# Patient Record
Sex: Male | Born: 1980 | Race: White | Hispanic: No | Marital: Single | Smoking: Current every day smoker
Health system: Southern US, Community
[De-identification: ages and names within clinical notes are randomized; demographics above are authoritative.]

## PROBLEM LIST (undated history)

## (undated) ENCOUNTER — Ambulatory Visit: Payer: BC Managed Care – PPO | Source: Home / Self Care

## (undated) DIAGNOSIS — Z98811 Dental restoration status: Secondary | ICD-10-CM

## (undated) DIAGNOSIS — S20411A Abrasion of right back wall of thorax, initial encounter: Secondary | ICD-10-CM

## (undated) DIAGNOSIS — S52501A Unspecified fracture of the lower end of right radius, initial encounter for closed fracture: Secondary | ICD-10-CM

## (undated) DIAGNOSIS — G40909 Epilepsy, unspecified, not intractable, without status epilepticus: Secondary | ICD-10-CM

## (undated) DIAGNOSIS — F329 Major depressive disorder, single episode, unspecified: Secondary | ICD-10-CM

## (undated) DIAGNOSIS — S43006A Unspecified dislocation of unspecified shoulder joint, initial encounter: Secondary | ICD-10-CM

## (undated) DIAGNOSIS — R569 Unspecified convulsions: Secondary | ICD-10-CM

## (undated) DIAGNOSIS — F32A Depression, unspecified: Secondary | ICD-10-CM

## (undated) HISTORY — PX: WISDOM TOOTH EXTRACTION: SHX21

## (undated) HISTORY — DX: Epilepsy, unspecified, not intractable, without status epilepticus: G40.909

---

## 2007-04-04 ENCOUNTER — Ambulatory Visit (HOSPITAL_COMMUNITY): Admission: RE | Admit: 2007-04-04 | Discharge: 2007-04-04 | Payer: Self-pay | Admitting: Family Medicine

## 2007-07-24 ENCOUNTER — Emergency Department (HOSPITAL_COMMUNITY): Admission: EM | Admit: 2007-07-24 | Discharge: 2007-07-24 | Payer: Self-pay | Admitting: Emergency Medicine

## 2007-08-07 ENCOUNTER — Inpatient Hospital Stay (HOSPITAL_COMMUNITY): Admission: EM | Admit: 2007-08-07 | Discharge: 2007-08-09 | Payer: Self-pay | Admitting: Emergency Medicine

## 2007-12-09 IMAGING — CT CT HEAD W/O CM
1 series · 16 of 30 positions shown, 20 images · non-contrast
Comparison: none

CLINICAL DATA: Seizure

[Series 2: head routine 4.8 h47s · axial · 0.41mm/px · z∈[-149,-19]mm · 16 of 30 slices shown, 20 images]
[im 2/30  brain]
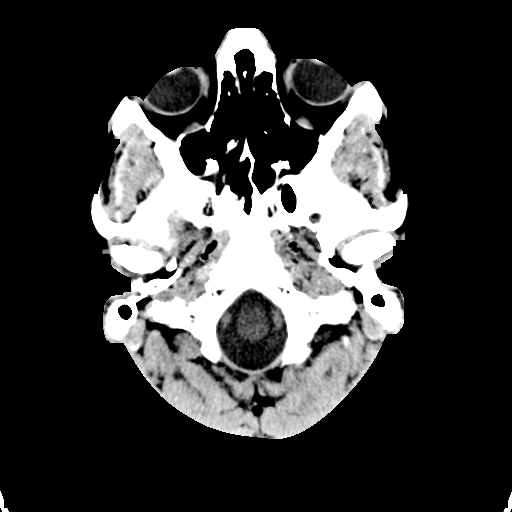
[im 2/30  bone]
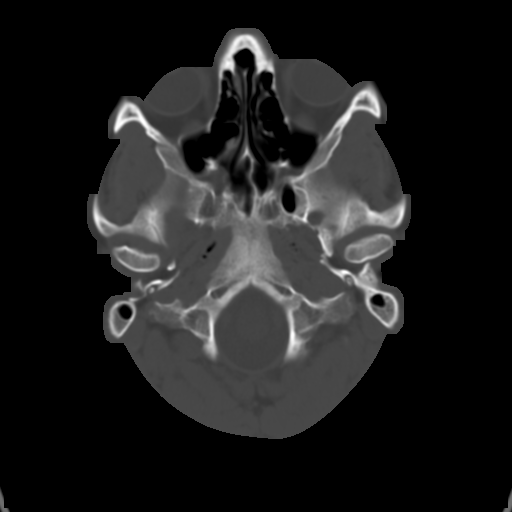
[im 4/30  brain]
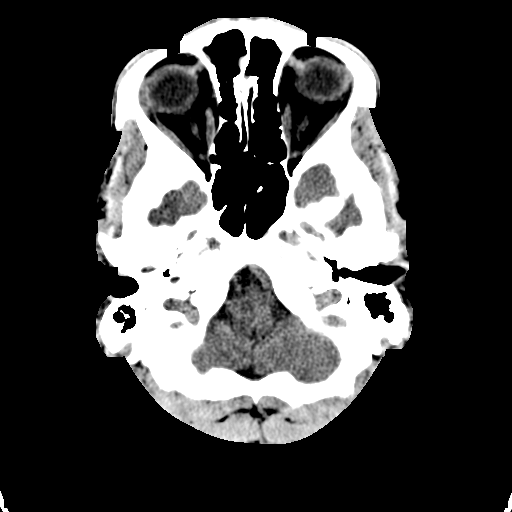
[im 6/30  brain]
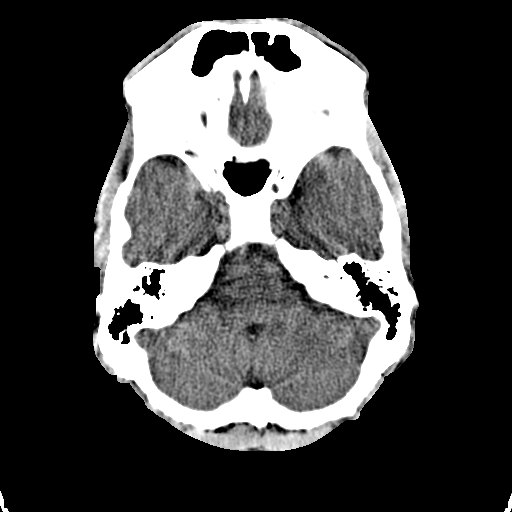
[im 8/30  brain]
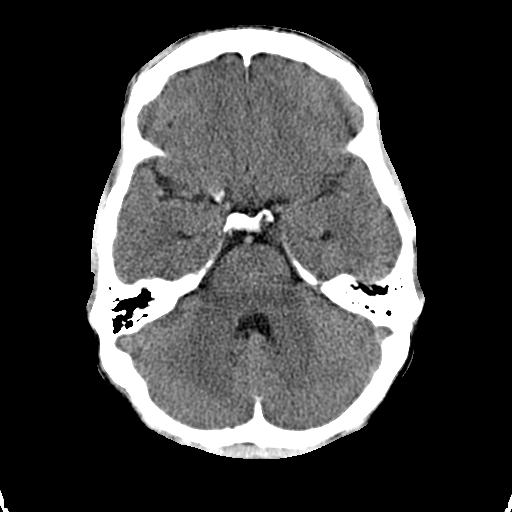
[im 9/30  brain]
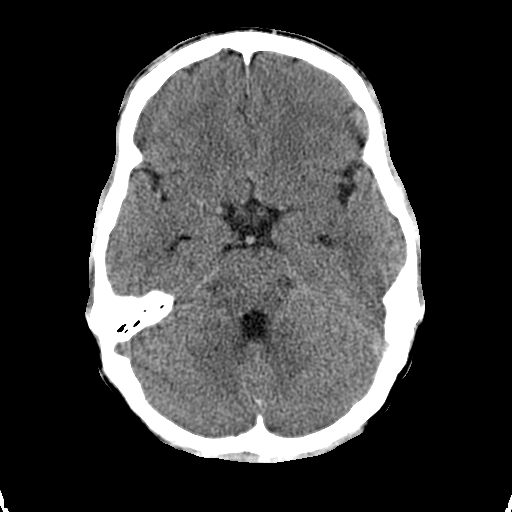
[im 9/30  bone]
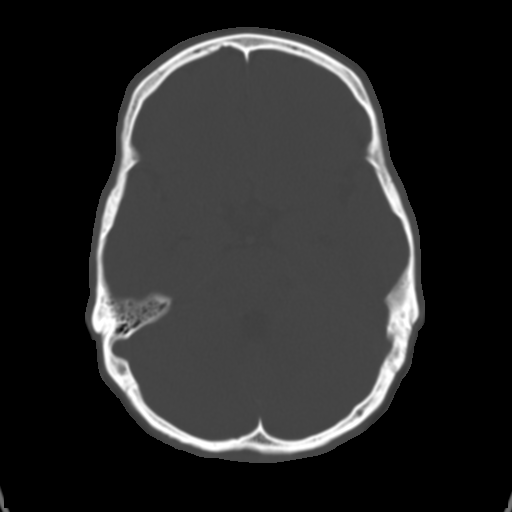
[im 11/30  brain]
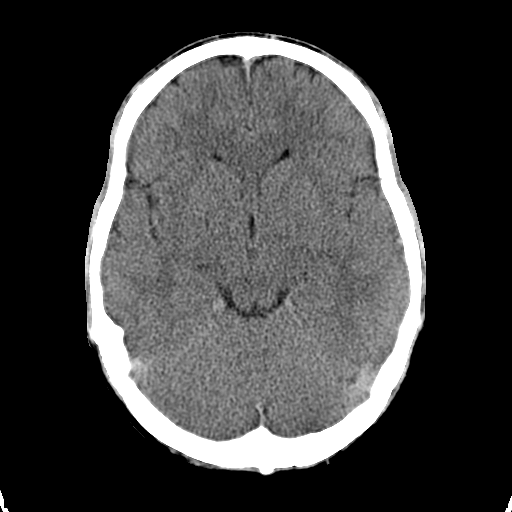
[im 13/30  brain]
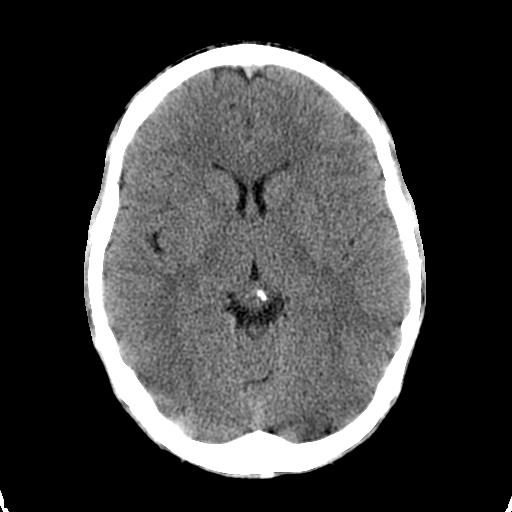
[im 15/30  brain]
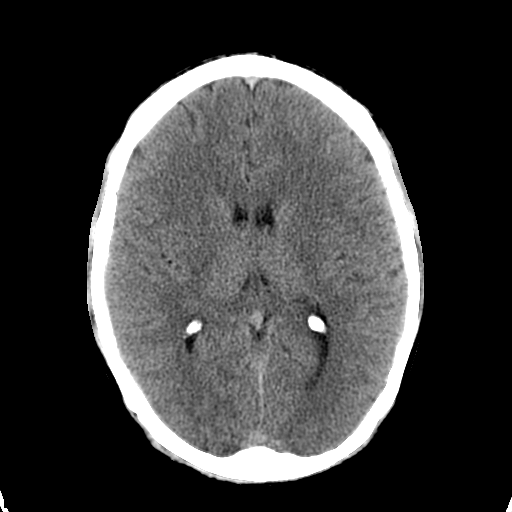
[im 16/30  brain]
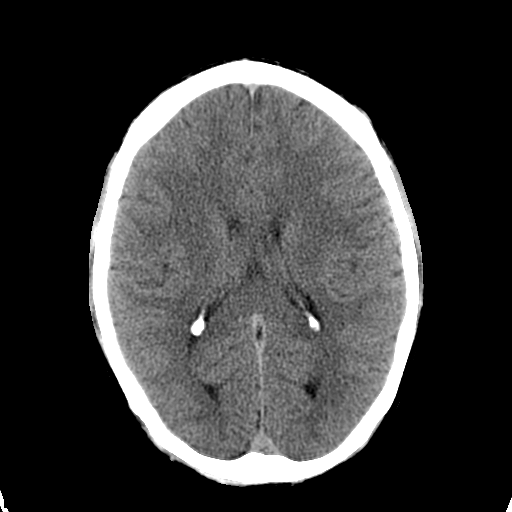
[im 16/30  bone]
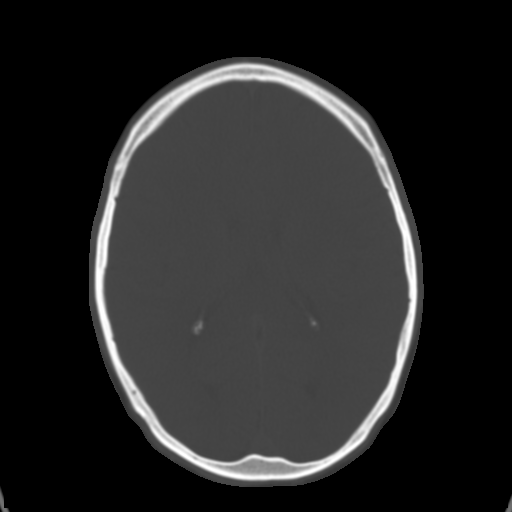
[im 18/30  brain]
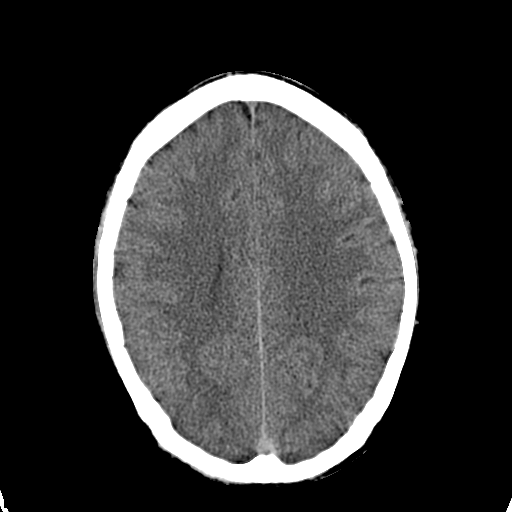
[im 20/30  brain]
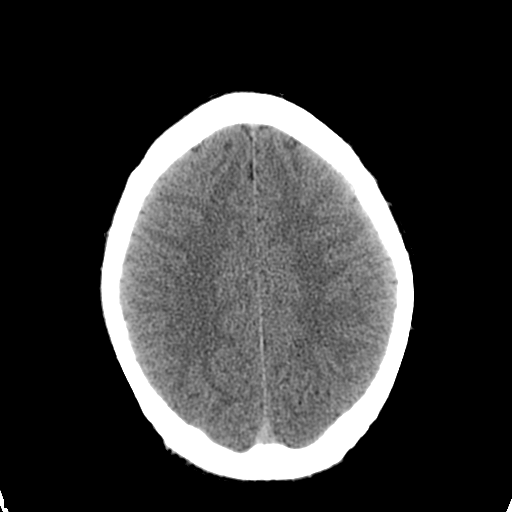
[im 22/30  brain]
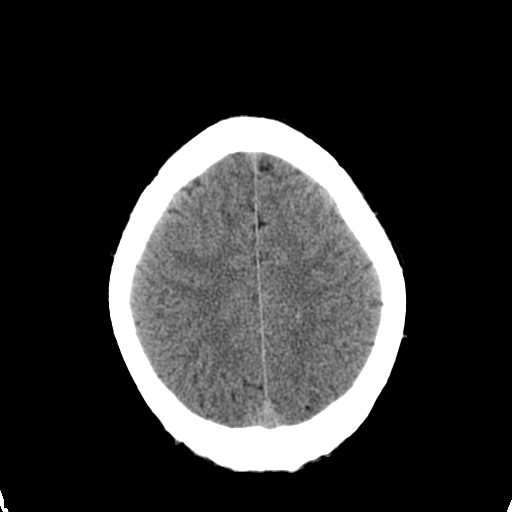
[im 23/30  brain]
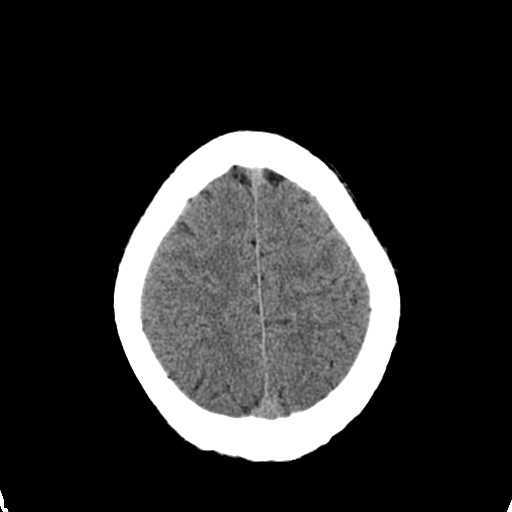
[im 23/30  bone]
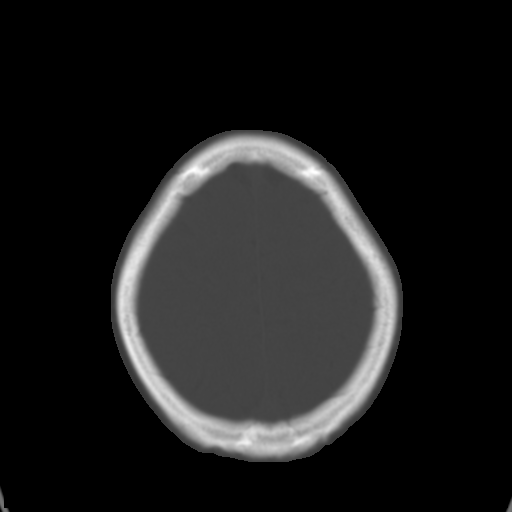
[im 25/30  brain]
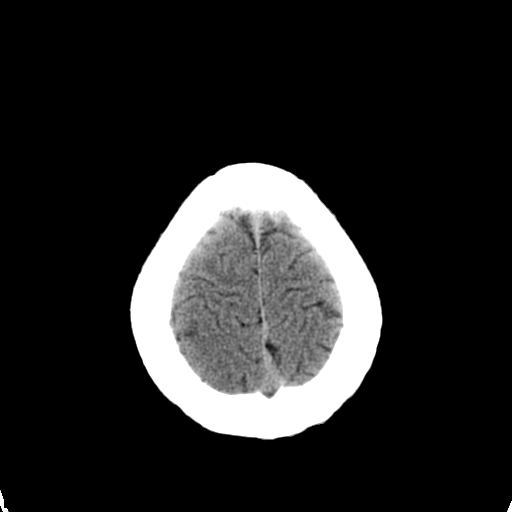
[im 27/30  brain]
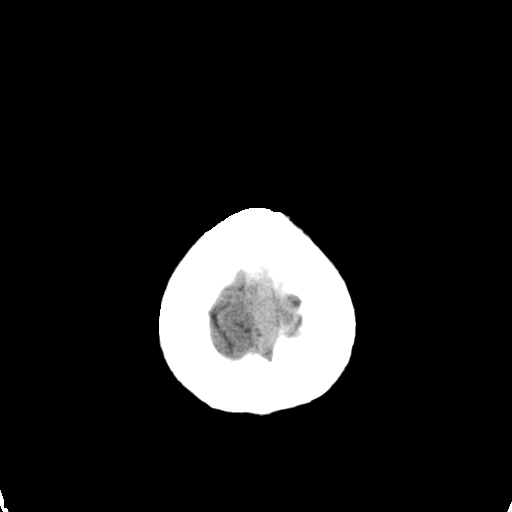
[im 29/30  brain]
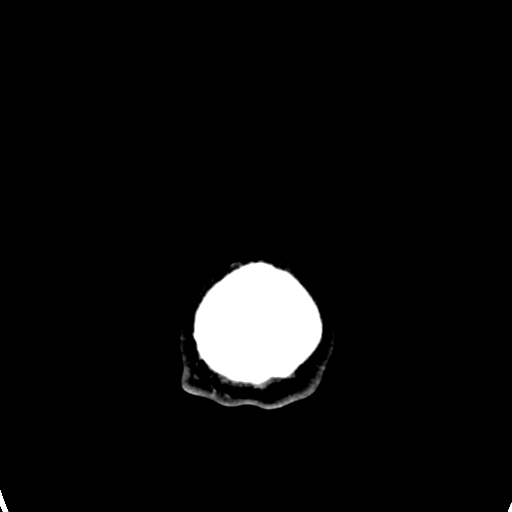

[16 of 30 positions shown; findings below may reference images not displayed]

CT head without contrast:

No previous for comparison.  There is no evidence of acute intracranial
hemorrhage, brain edema, mass,  mass effect, or midline shift. Acute infarct may
be inapparent on noncontrast CT.  No other intra-axial abnormalities are seen,
and the ventricles and sulci are within normal limits in size and symmetry.   No
abnormal extra-axial fluid collections or masses are identified.  No significant
calvarial abnormality.
IMPRESSION: 1. Negative non-contrast head CT.

## 2011-03-15 NOTE — Procedures (Signed)
EEG NUMBER:  [id number]   REFERRING PHYSICIAN:  Deanna Artis. Sharene Skeans, M.D.   CLINICAL INFORMATION:  This is a sleep-deprived EEG in a 30 year old  patient with a history of body stiffening and possible convulsive  activity.   TECHNICAL DESCRIPTION:  This EEG was recorded during the awake and  during the stage II sleep states.  Well-formed K complexes are seen, and  arousable is noted.  Photic stimulation was performed which did not  produce a driving response, and hyperventilation testing produced no  significant abnormalities.  The awake background activity was 13 Hz  rhythms with higher amplitudes seen in the posterior head regions.  Eye-  opening and eye-closing maneuvers did not significantly affect the  background activity.  On page 8 of the record, there was a left frontal  sharp wave present, but no evidence of any epileptiform activity was  seen on this study.   IMPRESSION:  This is a normal EEG during the awake and stage II sleep  states.           ______________________________  Genene Churn. Sandria Manly, M.D.     LKG:MWNU  D:  08/08/2007 09:18:02  T:  08/08/2007 13:39:31  Job #:  [id number]   cc:   Gabrielle Dare. Janee Morn, M.D.  [facility]  [address] [place], [place] [address]   Deanna Artis. Sharene Skeans, M.D.  Fax: [phone]

## 2011-03-15 NOTE — Procedures (Signed)
EEG NUMBER:  [id number].   CLINICAL HISTORY:  A 30 year old male with a syncopal episode (780.2).   PROCEDURE:  The tracing is carried out on a 32 channel digital Cadwell  recorder reformatted into 16 channel montages with one devoted to EKG.  The patient was allegedly sleep-deprived for the study.  He was awake  and alert throughout it.  The International 10/20 system lead placement  was used.   MEDICATIONS:  Keflex.   DESCRIPTION OF FINDINGS:  Dominant frequency is a 15-20 microvolt 12 Hz  activity that is well regulated.  Frontally predominant under 10  microvolt beta range activity was broadly distributed.   There was no focal slowing.  There was no interictal epileptiform  activity in the form of spikes or sharp waves.   Activating procedures with photic stimulation induced sustained driving  responses at 3, 5, 9, 11, 13 and 15 Hz.  There was a harmonic of double  the 7 Hz frequency.  EKG showed a regular sinus rhythm with ventricular  response of 96 beats per minute.   IMPRESSION:  Normal waking record.      Deanna Artis. Sharene Skeans, M.D.  Electronically Signed     VWU:[id number]  D:  04/04/2007 11:17:04  T:  04/04/2007 12:54:36  Job #:  [id number]   cc:   Caryn Bee L. Little, M.D.  Fax: [phone]
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
## 2011-08-11 LAB — DIFFERENTIAL
Basophils Relative: 0
Eosinophils Absolute: 0
Eosinophils Relative: 0
Lymphocytes Relative: 12
Lymphs Abs: 1.8
Monocytes Absolute: 0.5

## 2011-08-11 LAB — BASIC METABOLIC PANEL
BUN: 10
BUN: 10
BUN: 7
BUN: 9
BUN: 9
CO2: 26
Calcium: 8.8
Calcium: 9.1
Calcium: 9.4
Chloride: 106
Chloride: 106
Chloride: 107
Creatinine, Ser: 0.94
Creatinine, Ser: 1.15
GFR calc Af Amer: >60
GFR calc Af Amer: >60
GFR calc Af Amer: >60
GFR calc non Af Amer: >60
GFR calc non Af Amer: >60
GFR calc non Af Amer: >60
Glucose, Bld: 112 — ABNORMAL HIGH
Glucose, Bld: 94
Glucose, Bld: 97
Potassium: 3.8
Potassium: 3.9
Potassium: 4
Sodium: 138
Sodium: 138

## 2011-08-11 LAB — I-STAT 8, (EC8 V) (CONVERTED LAB)
Acid-Base Excess: 2
BUN: 7
Bicarbonate: 27 — ABNORMAL HIGH
Glucose, Bld: 109 — ABNORMAL HIGH
HCT: 46
Hemoglobin: 15.6
Operator id: 277751
Sodium: 138
TCO2: 28

## 2011-08-11 LAB — CBC
HCT: 41.6
Hemoglobin: 14.3
MCHC: 34.3
MCHC: 34.7
MCV: 93.3
Platelets: 292
RBC: 4.46
RBC: 4.7
WBC: 15.1 — ABNORMAL HIGH

## 2011-08-11 LAB — PROLACTIN: Prolactin: 5.4 (ref 2.1–17.1)

## 2011-08-11 LAB — HEPATIC FUNCTION PANEL
ALT: 16
Alkaline Phosphatase: 66
Bilirubin, Direct: 0.1
Indirect Bilirubin: 0.8

## 2011-08-11 LAB — RAPID URINE DRUG SCREEN, HOSP PERFORMED
Amphetamines: NOT DETECTED
Barbiturates: NOT DETECTED
Benzodiazepines: NOT DETECTED
Tetrahydrocannabinol: NOT DETECTED

## 2011-08-11 LAB — RPR: RPR Ser Ql: NONREACTIVE

## 2011-08-11 LAB — URINE CULTURE

## 2011-08-11 LAB — MAGNESIUM: Magnesium: 2.4

## 2011-08-11 LAB — CK: Total CK: 124

## 2013-09-12 ENCOUNTER — Encounter (HOSPITAL_COMMUNITY): Payer: Self-pay | Admitting: Emergency Medicine

## 2013-09-12 ENCOUNTER — Emergency Department (HOSPITAL_COMMUNITY)
Admission: EM | Admit: 2013-09-12 | Discharge: 2013-09-13 | Disposition: A | Discharge status: Home / Self Care | Payer: BC Managed Care – PPO | Attending: Emergency Medicine | Admitting: Emergency Medicine

## 2013-09-12 DIAGNOSIS — F3289 Other specified depressive episodes: Secondary | ICD-10-CM | POA: Insufficient documentation

## 2013-09-12 DIAGNOSIS — F329 Major depressive disorder, single episode, unspecified: Secondary | ICD-10-CM | POA: Insufficient documentation

## 2013-09-12 DIAGNOSIS — G40909 Epilepsy, unspecified, not intractable, without status epilepticus: Secondary | ICD-10-CM | POA: Insufficient documentation

## 2013-09-12 DIAGNOSIS — Z79899 Other long term (current) drug therapy: Secondary | ICD-10-CM | POA: Insufficient documentation

## 2013-09-12 DIAGNOSIS — F17200 Nicotine dependence, unspecified, uncomplicated: Secondary | ICD-10-CM | POA: Insufficient documentation

## 2013-09-12 DIAGNOSIS — R45851 Suicidal ideations: Secondary | ICD-10-CM | POA: Insufficient documentation

## 2013-09-12 HISTORY — DX: Major depressive disorder, single episode, unspecified: F32.9

## 2013-09-12 HISTORY — DX: Unspecified convulsions: R56.9

## 2013-09-12 HISTORY — DX: Depression, unspecified: F32.A

## 2013-09-12 LAB — RAPID URINE DRUG SCREEN, HOSP PERFORMED
Amphetamines: NOT DETECTED
Barbiturates: NOT DETECTED
Benzodiazepines: NOT DETECTED
Opiates: NOT DETECTED
Tetrahydrocannabinol: NOT DETECTED

## 2013-09-12 LAB — COMPREHENSIVE METABOLIC PANEL
ALT: 18 U/L (ref 0–53)
AST: 20 U/L (ref 0–37)
Albumin: 4.4 g/dL (ref 3.5–5.2)
Alkaline Phosphatase: 79 U/L (ref 39–117)
BUN: 11 mg/dL (ref 6–23)
Calcium: 9.2 mg/dL (ref 8.4–10.5)
Chloride: 102 mEq/L (ref 96–112)
Potassium: 3.8 mEq/L (ref 3.5–5.1)
Total Bilirubin: 0.4 mg/dL (ref 0.3–1.2)
Total Protein: 7.5 g/dL (ref 6.0–8.3)

## 2013-09-12 LAB — CBC
HCT: 45.4 % (ref 39.0–52.0)
MCHC: 35.9 g/dL (ref 30.0–36.0)
RDW: 12.5 % (ref 11.5–15.5)
WBC: 10.9 10*3/uL — ABNORMAL HIGH (ref 4.0–10.5)

## 2013-09-12 LAB — ETHANOL: Alcohol, Ethyl (B): 22 mg/dL — ABNORMAL HIGH (ref 0–11)

## 2013-09-12 MED ORDER — LAMOTRIGINE ER 50 MG PO TB24: 1.0000 | ORAL_TABLET | Freq: Every day | ORAL | Status: DC

## 2013-09-12 MED ORDER — LAMOTRIGINE 25 MG PO TABS
175.0000 mg | ORAL_TABLET | Freq: Two times a day (BID) | ORAL | Status: DC
  Filled 2013-09-12 (×2): qty 1

## 2013-09-12 MED ORDER — LAMOTRIGINE ER 300 MG PO TB24: 1.0000 | ORAL_TABLET | Freq: Every day | ORAL | Status: DC

## 2013-09-12 NOTE — ED Provider Notes (Signed)
CSN: [id number]     Arrival date & time 09/12/13  2017 History   First MD Initiated Contact with Patient 09/12/13 2328     Chief Complaint  Patient presents with  . Medical Clearance   (Consider location/radiation/quality/duration/timing/severity/associated sxs/prior Treatment) HPI Comments: Patient presents to the ER for evaluation of depression and suicidal thoughts. Patient reports that he recently had changes in his medical dosing and since then he has been experiencing thoughts of harming himself. He thinks it is the medicine is making him feel this way. He denies drug and alcohol use. Patient does not think he'll act on thoughts, but they have become pervasive and he is concerned enough that he came to the ER for help. He does not have a plan.   Past Medical History  Diagnosis Date  . Seizures    History reviewed. No pertinent past surgical history. History reviewed. No pertinent family history. History  Substance Use Topics  . Smoking status: Current Every Day Smoker    Types: Cigarettes  . Smokeless tobacco: Not on file  . Alcohol Use: No    Review of Systems  Psychiatric/Behavioral: Positive for suicidal ideas.  All other systems reviewed and are negative.    Allergies  Phenergan  Home Medications   Current Outpatient Rx  Name  Route  Sig  Dispense  Refill  . LamoTRIgine (LAMICTAL XR) 300 MG TB24   Oral   Take 1 tablet by mouth daily. Take with the 50mg  tablet to make total of 350mg          . LamoTRIgine (LAMICTAL XR) 50 MG TB24   Oral   Take 1 tablet by mouth daily.          BP 121/77  Pulse 73  Temp(Src) 97 F (36.1 C) (Oral)  Resp 18  Wt 137 lb (62.143 kg)  SpO2 98% Physical Exam  Constitutional: He is oriented to person, place, and time. He appears well-developed and well-nourished. No distress.  HENT:  Head: Normocephalic and atraumatic.  Right Ear: Hearing normal.  Left Ear: Hearing normal.  Nose: Nose normal.  Mouth/Throat: Oropharynx  is clear and moist and mucous membranes are normal.  Eyes: Conjunctivae and EOM are normal. Pupils are equal, round, and reactive to light.  Neck: Normal range of motion. Neck supple.  Cardiovascular: Regular rhythm, S1 normal and S2 normal.  Exam reveals no gallop and no friction rub.   No murmur heard. Pulmonary/Chest: Effort normal and breath sounds normal. No respiratory distress. He exhibits no tenderness.  Abdominal: Soft. Normal appearance and bowel sounds are normal. There is no hepatosplenomegaly. There is no tenderness. There is no rebound, no guarding, no tenderness at McBurney's point and negative Murphy's sign. No hernia.  Musculoskeletal: Normal range of motion.  Neurological: He is alert and oriented to person, place, and time. He has normal strength. No cranial nerve deficit or sensory deficit. Coordination normal. GCS eye subscore is 4. GCS verbal subscore is 5. GCS motor subscore is 6.  Skin: Skin is warm, dry and intact. No rash noted. No cyanosis.  Psychiatric: He has a normal mood and affect. His speech is normal and behavior is normal. Thought content normal.    ED Course  Procedures (including critical care time) Labs Review Labs Reviewed  CBC - Abnormal; Notable for the following:    WBC 10.9 (*)    All other components within normal limits  COMPREHENSIVE METABOLIC PANEL - Abnormal; Notable for the following:    GFR calc non  Af Amer 89 (*)    All other components within normal limits  ETHANOL - Abnormal; Notable for the following:    Alcohol, Ethyl (B) 22 (*)    All other components within normal limits  SALICYLATE LEVEL - Abnormal; Notable for the following:    Salicylate Lvl <2.0 (*)    All other components within normal limits  ACETAMINOPHEN LEVEL  URINE RAPID DRUG SCREEN (HOSP PERFORMED)   Imaging Review No results found.  EKG Interpretation   None       MDM  Diagnosis: Depression, suicidal ideation  Patient presents to the ER for evaluation of  suicidal ideation. The patient is here voluntarily. He does not think he is going back on the thoughts, but he is not completely sure. He has become concerned that he might harm himself and therefore came to the ER. He will require psychiatric evaluation. Patient is medically clear for evaluation and treatment.   Gilda Crease, MD 09/12/13 [phone]

## 2013-09-12 NOTE — ED Notes (Signed)
Pt states that he has been taking lamictal for several years for seizures. 3 months ago his dosage was increased. 1 month ago pt had a thought of "what it would be like to commit suicide" pt states that every since he has had the thought he keeps thinking about it and now feels like he wants to commit suicide. Pt states that he does not want to die but does not trust himself to not commit suicide.

## 2013-09-12 NOTE — ED Notes (Signed)
Presents with suicidal thoughts that began after beginning 350mg  of Lamictal. Pt states, "I am assuming it is because of the medications because I know I do not want to die. I just was wondering what It would be like to commit suicide and now that thought will not leave my head." pt is alert, oriented, appropriate. Denies drug and alcohol use. Denies plan to harm self. Denies HI.

## 2013-09-12 NOTE — ED Notes (Signed)
Pt changed into blue scrubs and house coverage called. Pt's family given belongings to take home per Pt

## 2013-09-13 ENCOUNTER — Encounter (HOSPITAL_COMMUNITY): Payer: Self-pay | Admitting: *Deleted

## 2013-09-13 NOTE — ED Notes (Addendum)
Per patient's request, Dad called to pick up. Dad is OTW.

## 2013-09-13 NOTE — ED Provider Notes (Signed)
Tele psyche has evaluated this patient and informed me that he is not actively suicidal. He came in feeling depressed and felt like his depression was secondary to increase in dose of Lamictal from 300mg  to 350mg .  The patient says he is not feeling suicidal. He does not feel like he needs to be hospitalized. He has no history of suicide attempt. He does not meet IVC crtieria and he wishes to go home.   With the pattient's permission, I  discussed with Dr. Tera Helper re: the patient's neurologist. She recommends going down on the patient's Lamictal dose from 350mg  to 300mg . She will arrange for the patient to follow up in her office as well as the Pyschiatric branch of their department.    Brandt Loosen, MD 09/13/13 [phone]

## 2013-09-13 NOTE — BH Assessment (Signed)
Assessment Note  Luke Vazquez is a 32 y.o. male presenting to [facility] c/o depression and SI.  Pt denies intent or plan to harm self.  Pt denies HI/AVH.  Pt says in the past month he's had SI, which started after his medication was increased; 300mg -350mg  of Lamictal.  Pt has no outpatient svcs with psychiatrist or therapist, no past admissions.  Pt endorses depressive sxs: insomnia, no interest in normal daily activities.  Pt can contract for safety. He says that [facility] prescribes Lamictal medication.  This Clinical research associate informed pt that psychiatric medications are not routinely changed through an emergency room visit. Pt was referred back to provider at [facility]. This writer provided local referrals for psych/therapist.  This writer contacted Dr. Lavella Lemons to discuss pt and disposition; encouraged EDP to talk with for final disposition.  Pt meet no criteria for admission.      Axis I: Mood Disorder NOS Axis II: Deferred Axis III:  Past Medical History  Diagnosis Date  . Seizures   . Mental disorder   . Depression    Axis IV: other psychosocial or environmental problems, problems related to social environment and problems with primary support group Axis V: 41-50 serious symptoms  Past Medical History:  Past Medical History  Diagnosis Date  . Seizures   . Mental disorder   . Depression     History reviewed. No pertinent past surgical history.  Family History: History reviewed. No pertinent family history.  Social History:  reports that he has been smoking Cigarettes.  He has been smoking about 0.00 packs per day. He does not have any smokeless tobacco history on file. He reports that he does not drink alcohol. His drug history is not on file.  Additional Social History:  Alcohol / Drug Use Pain Medications: None  Prescriptions: Lamictal  Over the Counter: None  History of alcohol / drug use?: No history of alcohol / drug abuse  CIWA: CIWA-Ar BP: 121/77 mmHg Pulse Rate:  73 COWS:    Allergies:  Allergies  Allergen Reactions  . Phenergan [Promethazine Hcl]      vomiting      Home Medications:  (Not in a hospital admission)  OB/GYN Status:  No LMP for male patient.  General Assessment Data Location of Assessment: [facility] ED Is this a Tele or Face-to-Face Assessment?: Tele Assessment Is this an Initial Assessment or a Re-assessment for this encounter?: Initial Assessment Living Arrangements: Alone Can pt return to current living arrangement?: Yes Admission Status: Voluntary Is patient capable of signing voluntary admission?: Yes Transfer from: Acute Hospital Referral Source: MD  Medical Screening Exam [facility] Walk-in ONLY) Medical Exam completed: No Reason for MSE not completed: Other: (None )  [facility] Crisis Care Plan Living Arrangements: Alone Name of Psychiatrist: None  Name of Therapist: None   Education Status Is patient currently in school?: No Current Grade: None  Highest grade of school patient has completed: None  Name of school: None  Contact person: None   Risk to self Suicidal Ideation: No-Not Currently/Within Last 6 Months Suicidal Intent: No-Not Currently/Within Last 6 Months Is patient at risk for suicide?: Yes Suicidal Plan?: No Access to Means: No What has been your use of drugs/alcohol within the last 12 months?: Pt denies  Previous Attempts/Gestures: No How many times?: 0 Other Self Harm Risks: None  Triggers for Past Attempts: None known Intentional Self Injurious Behavior: None Family Suicide History: No Recent stressful life event(s): Other (Comment) (Medication change 1 month ago )  Persecutory voices/beliefs?: No Depression: Yes Depression Symptoms: Loss of interest in usual pleasures Substance abuse history and/or treatment for substance abuse?: No Suicide prevention information given to non-admitted patients: Not applicable  Risk to Others Homicidal Ideation: No Thoughts of Harm to Others: No Current  Homicidal Intent: No Current Homicidal Plan: No Access to Homicidal Means: No Identified Victim: None  History of harm to others?: No Assessment of Violence: None Noted Violent Behavior Description: None  Does patient have access to weapons?: No Criminal Charges Pending?: No Does patient have a court date: No  Psychosis Hallucinations: None noted Delusions: None noted  Mental Status Report Appear/Hygiene: Other (Comment) (Appropriate ) Eye Contact: Good Motor Activity: Unremarkable Speech: Logical/coherent Level of Consciousness: Alert Mood: Depressed;Irritable Affect: Depressed;Irritable Anxiety Level: None Thought Processes: Coherent;Relevant Judgement: Unimpaired Orientation: Person;Place;Time;Situation Obsessive Compulsive Thoughts/Behaviors: None  Cognitive Functioning Concentration: Normal Memory: Recent Intact;Remote Intact IQ: Average Insight: Fair Impulse Control: Fair Appetite: Good Weight Loss: 0 Weight Gain: 0 Sleep: Decreased Total Hours of Sleep: 4 Vegetative Symptoms: None  ADLScreening [facility] Assessment Services) Patient's cognitive ability adequate to safely complete daily activities?: Yes Patient able to express need for assistance with ADLs?: Yes Independently performs ADLs?: Yes (appropriate for developmental age)  Prior Inpatient Therapy Prior Inpatient Therapy: No Prior Therapy Dates: None  Prior Therapy Facilty/Provider(s): None  Reason for Treatment: None   Prior Outpatient Therapy Prior Outpatient Therapy: No Prior Therapy Dates: None  Prior Therapy Facilty/Provider(s): None  Reason for Treatment: None   ADL Screening (condition at time of admission) Patient's cognitive ability adequate to safely complete daily activities?: Yes Is the patient deaf or have difficulty hearing?: No Does the patient have difficulty seeing, even when wearing glasses/contacts?: No Does the patient have difficulty concentrating, remembering, or making  decisions?: No Patient able to express need for assistance with ADLs?: Yes Does the patient have difficulty dressing or bathing?: No Independently performs ADLs?: Yes (appropriate for developmental age) Does the patient have difficulty walking or climbing stairs?: No Weakness of Legs: None Weakness of Arms/Hands: None  Home Assistive Devices/Equipment Home Assistive Devices/Equipment: None  Therapy Consults (therapy consults require a physician order) PT Evaluation Needed: No OT Evalulation Needed: No SLP Evaluation Needed: No Abuse/Neglect Assessment (Assessment to be complete while patient is alone) Physical Abuse: Denies Verbal Abuse: Denies Sexual Abuse: Denies Exploitation of patient/patient's resources: Denies Self-Neglect: Denies Values / Beliefs Cultural Requests During Hospitalization: None Spiritual Requests During Hospitalization: None Consults Spiritual Care Consult Needed: No Social Work Consult Needed: No Merchant navy officer (For Healthcare) Advance Directive: Patient does not have advance directive;Patient would not like information Pre-existing out of facility DNR order (yellow form or pink MOST form): No Nutrition Screen- [facility] Adult/[facility]/AP Patient's home diet: Regular  Additional Information 1:1 In Past 12 Months?: No CIRT Risk: No Elopement Risk: No Does patient have medical clearance?: Yes     Disposition:  Disposition Initial Assessment Completed for this Encounter: Yes Disposition of Patient: Outpatient treatment;Referred to (Current provider ) Type of outpatient treatment: Adult Patient referred to: Outpatient clinic referral;Other (Comment) (Current provider )  On Site Evaluation by:   Reviewed with Physician:    Murrell Redden 09/13/2013 3:07 AM

## 2013-09-13 NOTE — ED Notes (Signed)
Dr.Manley at bedside  

## 2013-09-13 NOTE — ED Notes (Signed)
Pt's mother called. Message given to pt to make sure to call her when he got his cell phone back.

## 2014-01-24 DIAGNOSIS — G40209 Localization-related (focal) (partial) symptomatic epilepsy and epileptic syndromes with complex partial seizures, not intractable, without status epilepticus: Secondary | ICD-10-CM | POA: Insufficient documentation

## 2014-01-28 DIAGNOSIS — F39 Unspecified mood [affective] disorder: Secondary | ICD-10-CM | POA: Insufficient documentation

## 2016-01-22 DIAGNOSIS — K5903 Drug induced constipation: Secondary | ICD-10-CM | POA: Insufficient documentation

## 2016-03-12 DIAGNOSIS — S43006A Unspecified dislocation of unspecified shoulder joint, initial encounter: Secondary | ICD-10-CM

## 2016-03-12 DIAGNOSIS — S52501A Unspecified fracture of the lower end of right radius, initial encounter for closed fracture: Secondary | ICD-10-CM

## 2016-03-12 DIAGNOSIS — S20411A Abrasion of right back wall of thorax, initial encounter: Secondary | ICD-10-CM

## 2016-03-12 HISTORY — DX: Abrasion of right back wall of thorax, initial encounter: S20.411A

## 2016-03-12 HISTORY — DX: Unspecified dislocation of unspecified shoulder joint, initial encounter: S43.006A

## 2016-03-12 HISTORY — DX: Unspecified fracture of the lower end of right radius, initial encounter for closed fracture: S52.501A

## 2016-03-14 ENCOUNTER — Other Ambulatory Visit: Payer: Self-pay | Admitting: Orthopedic Surgery

## 2016-03-14 ENCOUNTER — Encounter (HOSPITAL_BASED_OUTPATIENT_CLINIC_OR_DEPARTMENT_OTHER): Payer: Self-pay | Admitting: *Deleted

## 2016-03-17 ENCOUNTER — Ambulatory Visit (HOSPITAL_BASED_OUTPATIENT_CLINIC_OR_DEPARTMENT_OTHER): Payer: BLUE CROSS/BLUE SHIELD | Admitting: Anesthesiology

## 2016-03-17 ENCOUNTER — Encounter (HOSPITAL_BASED_OUTPATIENT_CLINIC_OR_DEPARTMENT_OTHER): Payer: Self-pay

## 2016-03-17 ENCOUNTER — Ambulatory Visit (HOSPITAL_BASED_OUTPATIENT_CLINIC_OR_DEPARTMENT_OTHER)
Admission: RE | Admit: 2016-03-17 | Discharge: 2016-03-17 | Disposition: A | Discharge status: Home / Self Care | Payer: BLUE CROSS/BLUE SHIELD | Source: Ambulatory Visit | Attending: Orthopedic Surgery | Admitting: Orthopedic Surgery

## 2016-03-17 ENCOUNTER — Encounter (HOSPITAL_BASED_OUTPATIENT_CLINIC_OR_DEPARTMENT_OTHER)
Admission: RE | Disposition: A | Discharge status: Home / Self Care | Payer: Self-pay | Source: Ambulatory Visit | Attending: Orthopedic Surgery

## 2016-03-17 DIAGNOSIS — R569 Unspecified convulsions: Secondary | ICD-10-CM | POA: Diagnosis not present

## 2016-03-17 DIAGNOSIS — Z79899 Other long term (current) drug therapy: Secondary | ICD-10-CM | POA: Insufficient documentation

## 2016-03-17 DIAGNOSIS — F17210 Nicotine dependence, cigarettes, uncomplicated: Secondary | ICD-10-CM | POA: Diagnosis not present

## 2016-03-17 DIAGNOSIS — S52551A Other extraarticular fracture of lower end of right radius, initial encounter for closed fracture: Secondary | ICD-10-CM | POA: Insufficient documentation

## 2016-03-17 DIAGNOSIS — S52571A Other intraarticular fracture of lower end of right radius, initial encounter for closed fracture: Secondary | ICD-10-CM | POA: Diagnosis present

## 2016-03-17 HISTORY — PX: OPEN REDUCTION INTERNAL FIXATION (ORIF) DISTAL RADIAL FRACTURE: SHX5989

## 2016-03-17 HISTORY — DX: Unspecified fracture of the lower end of right radius, initial encounter for closed fracture: S52.501A

## 2016-03-17 HISTORY — DX: Unspecified dislocation of unspecified shoulder joint, initial encounter: S43.006A

## 2016-03-17 HISTORY — DX: Abrasion of right back wall of thorax, initial encounter: S20.411A

## 2016-03-17 HISTORY — DX: Dental restoration status: Z98.811

## 2016-03-17 SURGERY — OPEN REDUCTION INTERNAL FIXATION (ORIF) DISTAL RADIUS FRACTURE
Anesthesia: General | Site: Arm Lower | Laterality: Right

## 2016-03-17 MED ORDER — OXYCODONE-ACETAMINOPHEN 5-325 MG PO TABS: ORAL_TABLET | ORAL | Status: DC

## 2016-03-17 MED ORDER — MEPERIDINE HCL 25 MG/ML IJ SOLN: 6.2500 mg | INTRAMUSCULAR | Status: DC | PRN

## 2016-03-17 MED ORDER — CEFAZOLIN SODIUM-DEXTROSE 2-4 GM/100ML-% IV SOLN
2.0000 g | INTRAVENOUS | Status: AC
  Administered 2016-03-17: 2 g via INTRAVENOUS

## 2016-03-17 MED ORDER — LIDOCAINE HCL (CARDIAC) 20 MG/ML IV SOLN
INTRAVENOUS | Status: DC | PRN
  Administered 2016-03-17: 40 mg via INTRAVENOUS

## 2016-03-17 MED ORDER — BUPIVACAINE-EPINEPHRINE (PF) 0.5% -1:200000 IJ SOLN
INTRAMUSCULAR | Status: DC | PRN
  Administered 2016-03-17: 25 mL via PERINEURAL

## 2016-03-17 MED ORDER — FENTANYL CITRATE (PF) 100 MCG/2ML IJ SOLN
50.0000 ug | INTRAMUSCULAR | Status: DC | PRN
  Administered 2016-03-17 (×2): 100 ug via INTRAVENOUS

## 2016-03-17 MED ORDER — HYDROMORPHONE HCL 1 MG/ML IJ SOLN
0.2500 mg | INTRAMUSCULAR | Status: DC | PRN
Start: 2016-03-17 — End: 2016-03-17

## 2016-03-17 MED ORDER — FENTANYL CITRATE (PF) 100 MCG/2ML IJ SOLN
INTRAMUSCULAR | Status: AC
  Filled 2016-03-17: qty 2

## 2016-03-17 MED ORDER — LACTATED RINGERS IV SOLN
INTRAVENOUS | Status: DC
  Administered 2016-03-17 (×2): via INTRAVENOUS

## 2016-03-17 MED ORDER — CEFAZOLIN SODIUM-DEXTROSE 2-4 GM/100ML-% IV SOLN
INTRAVENOUS | Status: AC
  Filled 2016-03-17: qty 100

## 2016-03-17 MED ORDER — OXYCODONE HCL 5 MG/5ML PO SOLN: 5.0000 mg | Freq: Once | ORAL | Status: DC | PRN

## 2016-03-17 MED ORDER — GLYCOPYRROLATE 0.2 MG/ML IJ SOLN: 0.2000 mg | Freq: Once | INTRAMUSCULAR | Status: DC | PRN

## 2016-03-17 MED ORDER — EPHEDRINE SULFATE 50 MG/ML IJ SOLN
INTRAMUSCULAR | Status: DC | PRN
  Administered 2016-03-17: 10 mg via INTRAVENOUS

## 2016-03-17 MED ORDER — SCOPOLAMINE 1 MG/3DAYS TD PT72: 1.0000 | MEDICATED_PATCH | Freq: Once | TRANSDERMAL | Status: DC | PRN

## 2016-03-17 MED ORDER — MIDAZOLAM HCL 2 MG/2ML IJ SOLN
INTRAMUSCULAR | Status: AC
  Filled 2016-03-17: qty 2

## 2016-03-17 MED ORDER — OXYCODONE HCL 5 MG PO TABS: 5.0000 mg | ORAL_TABLET | Freq: Once | ORAL | Status: DC | PRN

## 2016-03-17 MED ORDER — CHLORHEXIDINE GLUCONATE 4 % EX LIQD: 60.0000 mL | Freq: Once | CUTANEOUS | Status: DC

## 2016-03-17 MED ORDER — PROPOFOL 10 MG/ML IV BOLUS
INTRAVENOUS | Status: DC | PRN
  Administered 2016-03-17: 200 mg via INTRAVENOUS

## 2016-03-17 MED ORDER — DEXAMETHASONE SODIUM PHOSPHATE 10 MG/ML IJ SOLN
INTRAMUSCULAR | Status: DC | PRN
  Administered 2016-03-17: 10 mg via INTRAVENOUS

## 2016-03-17 MED ORDER — MIDAZOLAM HCL 2 MG/2ML IJ SOLN
1.0000 mg | INTRAMUSCULAR | Status: DC | PRN
  Administered 2016-03-17 (×2): 2 mg via INTRAVENOUS

## 2016-03-17 SURGICAL SUPPLY — 66 items
BANDAGE ACE 3X5.8 VEL STRL LF (GAUZE/BANDAGES/DRESSINGS) ×3 IMPLANT
BIT DRILL 2.0 LNG QUCK RELEASE (BIT) IMPLANT
BIT DRILL 2.8X5 QR DISP (BIT) ×2 IMPLANT
BLADE SURG 15 STRL LF DISP TIS (BLADE) ×2 IMPLANT
BLADE SURG 15 STRL SS (BLADE) ×6
BNDG CMPR 9X4 STRL LF SNTH (GAUZE/BANDAGES/DRESSINGS) ×1
BNDG ESMARK 4X9 LF (GAUZE/BANDAGES/DRESSINGS) ×3 IMPLANT
BNDG GAUZE ELAST 4 BULKY (GAUZE/BANDAGES/DRESSINGS) ×3 IMPLANT
BNDG PLASTER X FAST 3X3 WHT LF (CAST SUPPLIES) ×20 IMPLANT
BNDG PLSTR 9X3 FST ST WHT (CAST SUPPLIES) ×10
CHLORAPREP W/TINT 26ML (MISCELLANEOUS) ×3 IMPLANT
CORDS BIPOLAR (ELECTRODE) ×3 IMPLANT
COVER BACK TABLE 60X90IN (DRAPES) ×3 IMPLANT
COVER MAYO STAND STRL (DRAPES) ×3 IMPLANT
DRAPE EXTREMITY T 121X128X90 (DRAPE) ×3 IMPLANT
DRAPE OEC MINIVIEW 54X84 (DRAPES) ×3 IMPLANT
DRAPE SURG 17X23 STRL (DRAPES) ×3 IMPLANT
DRILL 2.0 LNG QUICK RELEASE (BIT) ×3
GAUZE SPONGE 4X4 12PLY STRL (GAUZE/BANDAGES/DRESSINGS) ×3 IMPLANT
GAUZE XEROFORM 1X8 LF (GAUZE/BANDAGES/DRESSINGS) ×3 IMPLANT
GLOVE BIO SURGEON STRL SZ7.5 (GLOVE) ×3 IMPLANT
GLOVE BIOGEL PI IND STRL 8 (GLOVE) ×1 IMPLANT
GLOVE BIOGEL PI IND STRL 8.5 (GLOVE) ×1 IMPLANT
GLOVE BIOGEL PI INDICATOR 8 (GLOVE) ×2
GLOVE BIOGEL PI INDICATOR 8.5 (GLOVE) ×2
GLOVE SURG ORTHO 8.0 STRL STRW (GLOVE) IMPLANT
GOWN STRL REUS W/ TWL LRG LVL3 (GOWN DISPOSABLE) ×1 IMPLANT
GOWN STRL REUS W/TWL LRG LVL3 (GOWN DISPOSABLE) ×3
GOWN STRL REUS W/TWL XL LVL3 (GOWN DISPOSABLE) ×7 IMPLANT
GUIDEWIRE ORTHO 0.054X6 (WIRE) ×6 IMPLANT
NDL HYPO 25X1 1.5 SAFETY (NEEDLE) IMPLANT
NEEDLE HYPO 25X1 1.5 SAFETY (NEEDLE) ×3 IMPLANT
NS IRRIG 1000ML POUR BTL (IV SOLUTION) ×3 IMPLANT
PACK BASIN DAY SURGERY FS (CUSTOM PROCEDURE TRAY) ×3 IMPLANT
PAD CAST 3X4 CTTN HI CHSV (CAST SUPPLIES) ×1 IMPLANT
PADDING CAST ABS 4INX4YD NS (CAST SUPPLIES) ×2
PADDING CAST ABS COTTON 4X4 ST (CAST SUPPLIES) ×1 IMPLANT
PADDING CAST COTTON 3X4 STRL (CAST SUPPLIES) ×3
PLATE PROXIMAL VDU ACULOC (Plate) ×3 IMPLANT
SCREW CORT FT 20X2.3XLCK HEX (Screw) IMPLANT
SCREW CORT FT 22X2.3XLCK HEX (Screw) IMPLANT
SCREW CORTICAL LOCKING 2.3X18M (Screw) ×3 IMPLANT
SCREW CORTICAL LOCKING 2.3X20M (Screw) ×12 IMPLANT
SCREW CORTICAL LOCKING 2.3X22M (Screw) ×3 IMPLANT
SCREW FX18X2.3XSMTH LCK NS CRT (Screw) ×1 IMPLANT
SCREW FX20X2.3XSMTH LCK NS CRT (Screw) ×3 IMPLANT
SCREW HEXALOBE NON-LOCK 3.5X14 (Screw) ×4 IMPLANT
SCREW HEXALOBE NON-LOCK 3.5X16 (Screw) ×3 IMPLANT
SLEEVE SCD COMPRESS KNEE MED (MISCELLANEOUS) ×3 IMPLANT
STOCKINETTE 4X48 STRL (DRAPES) ×3 IMPLANT
SUCTION FRAZIER HANDLE 10FR (MISCELLANEOUS)
SUCTION TUBE FRAZIER 10FR DISP (MISCELLANEOUS) IMPLANT
SUT ETHILON 3 0 PS 1 (SUTURE) IMPLANT
SUT ETHILON 4 0 PS 2 18 (SUTURE) ×3 IMPLANT
SUT VIC AB 2-0 SH 27 (SUTURE)
SUT VIC AB 2-0 SH 27XBRD (SUTURE) IMPLANT
SUT VIC AB 3-0 PS1 18 (SUTURE)
SUT VIC AB 3-0 PS1 18XBRD (SUTURE) IMPLANT
SUT VICRYL 4-0 PS2 18IN ABS (SUTURE) ×3 IMPLANT
SYR BULB 3OZ (MISCELLANEOUS) ×3 IMPLANT
SYR CONTROL 10ML LL (SYRINGE) ×2 IMPLANT
TOWEL OR 17X24 6PK STRL BLUE (TOWEL DISPOSABLE) ×6 IMPLANT
TOWEL OR NON WOVEN STRL DISP B (DISPOSABLE) ×3 IMPLANT
TUBE CONNECTING 20'X1/4 (TUBING) ×1
TUBE CONNECTING 20X1/4 (TUBING) ×2 IMPLANT
UNDERPAD 30X30 (UNDERPADS AND DIAPERS) ×3 IMPLANT

## 2016-03-17 NOTE — Transfer of Care (Signed)
Immediate Anesthesia Transfer of Care Note  Patient: Luke Vazquez  Procedure(s) Performed: Procedure(s): OPEN REDUCTION INTERNAL FIXATION (ORIF) RIGHT DISTAL RADIAL FRACTURE (Right)  Patient Location: PACU  Anesthesia Type:GA combined with regional for post-op pain  Level of Consciousness: sedated  Airway & Oxygen Therapy: Patient Spontanous Breathing and Patient connected to face mask oxygen  Post-op Assessment: Report given to RN and Post -op Vital signs reviewed and stable  Post vital signs: Reviewed and stable  Last Vitals:  Filed Vitals:   03/17/16 1030 03/17/16 1045  BP: 124/69 125/73  Pulse: 88 80  Temp:    Resp: 23 17    Last Pain:  Filed Vitals:   03/17/16 1056  PainSc: 8          Complications: No apparent anesthesia complications

## 2016-03-17 NOTE — Op Note (Signed)
Intra-operative fluoroscopic images in the AP, lateral, and oblique views were taken and evaluated by myself.  Reduction and hardware placement were confirmed.  There was no intraarticular penetration of permanent hardware.  

## 2016-03-17 NOTE — Brief Op Note (Signed)
03/17/2016  12:39 PM  PATIENT:  Nelida MeuseJames L Underdown  35 y.o. male  PRE-OPERATIVE DIAGNOSIS:  right distal radius fracture  S52.571  POST-OPERATIVE DIAGNOSIS:  right distal radius fracture  S52.571  PROCEDURE:  Procedure(s): OPEN REDUCTION INTERNAL FIXATION (ORIF) RIGHT DISTAL RADIAL FRACTURE (Right)  SURGEON:  Surgeon(s) and Role:    * Betha LoaKevin Neve Branscomb, MD - Primary    * Cindee SaltGary Garnet Chatmon, MD - Assisting  PHYSICIAN ASSISTANT:   ASSISTANTS: Cindee SaltGary Cleburne Savini, MD   ANESTHESIA:   regional and general  EBL:  Total I/O In: 1000 [I.V.:1000] Out: 2 [Blood:2]  BLOOD ADMINISTERED:none  DRAINS: none   LOCAL MEDICATIONS USED:  NONE  SPECIMEN:  No Specimen  DISPOSITION OF SPECIMEN:  N/A  COUNTS:  YES  TOURNIQUET:  * Missing tourniquet times found for documented tourniquets in log:  [id number] *  DICTATION: .Other Dictation: Dictation Number [id number]  PLAN OF CARE: Discharge to home after PACU  PATIENT DISPOSITION:  PACU - hemodynamically stable.

## 2016-03-17 NOTE — Anesthesia Preprocedure Evaluation (Signed)
Anesthesia Evaluation  Patient identified by MRN, date of birth, ID band Patient awake    Reviewed: Allergy & Precautions, NPO status , Patient's Chart, lab work & pertinent test results  Airway Mallampati: I  TM Distance: >3 FB Neck ROM: Full    Dental  (+) Teeth Intact, Dental Advisory Given   Pulmonary Current Smoker,    breath sounds clear to auscultation       Cardiovascular  Rhythm:Regular Rate:Normal     Neuro/Psych    GI/Hepatic   Endo/Other    Renal/GU      Musculoskeletal   Abdominal   Peds  Hematology   Anesthesia Other Findings   Reproductive/Obstetrics                             Anesthesia Physical Anesthesia Plan  ASA: II  Anesthesia Plan: General   Post-op Pain Management: GA combined w/ Regional for post-op pain   Induction: Intravenous  Airway Management Planned: LMA  Additional Equipment:   Intra-op Plan:   Post-operative Plan: Extubation in OR  Informed Consent: I have reviewed the patients History and Physical, chart, labs and discussed the procedure including the risks, benefits and alternatives for the proposed anesthesia with the patient or authorized representative who has indicated his/her understanding and acceptance.   Dental advisory given  Plan Discussed with:   Anesthesia Plan Comments:         Anesthesia Quick Evaluation

## 2016-03-17 NOTE — Progress Notes (Signed)
Assisted Dr. Crews with right, ultrasound guided, infraclavicular block. Side rails up, monitors on throughout procedure. See vital signs in flow sheet. Tolerated Procedure well. 
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
## 2016-03-17 NOTE — Op Note (Signed)
[id number] 

## 2016-03-17 NOTE — Op Note (Signed)
I assisted Surgeon(s) and Role:    * Betha LoaKevin Kaamil Morefield, MD - Primary    * Cindee SaltGary Emilee Market, MD - Assisting on the Procedure(s): OPEN REDUCTION INTERNAL FIXATION (ORIF) RIGHT DISTAL RADIAL FRACTURE on 03/17/2016.  I provided assistance on this case as follows: approach, retraction, reduction of the fracture, stabilization of the fracture, placement and fixation with internal plate and screws, closure of the incision and application of dressing and splints. I was present for the entire case.  Electronically signed by: Nicki ReaperKUZMA,Ayanah Snader R, MD Date: 03/17/2016 Time: 12:48 PM

## 2016-03-17 NOTE — Discharge Instructions (Addendum)

## 2016-03-17 NOTE — Anesthesia Procedure Notes (Addendum)
Anesthesia Regional Block:  Infraclavicular brachial plexus block  Pre-Anesthetic Checklist: ,, timeout performed, Correct Patient, Correct Site, Correct Laterality, Correct Procedure, Correct Position, site marked, Risks and benefits discussed,  Surgical consent,  Pre-op evaluation,  At surgeon's request and post-op pain management  Laterality: Right and Upper  Prep: chloraprep       Needles:  Injection technique: Single-shot  Needle Type: Echogenic Stimulator Needle     Needle Length: 5cm 5 cm Needle Gauge: 21 and 21 G    Additional Needles:  Procedures: ultrasound guided (picture in chart) Infraclavicular brachial plexus block Narrative:  Start time: 03/17/2016 10:14 AM End time: 03/17/2016 10:19 AM Injection made incrementally with aspirations every 5 mL.  Performed by: Personally  Anesthesiologist: CREWS, DAVID   Procedure Name: LMA Insertion Date/Time: 03/17/2016 11:38 AM Performed by: Argyle DesanctisLINKA, Ariahna Smiddy L Pre-anesthesia Checklist: Patient identified, Emergency Drugs available, Suction available, Patient being monitored and Timeout performed Patient Re-evaluated:Patient Re-evaluated prior to inductionOxygen Delivery Method: Circle System Utilized Preoxygenation: Pre-oxygenation with 100% oxygen Intubation Type: IV induction Ventilation: Mask ventilation without difficulty LMA: LMA inserted LMA Size: 4.0 Number of attempts: 1 Airway Equipment and Method: Bite block Placement Confirmation: positive ETCO2 Tube secured with: Tape Dental Injury: Teeth and Oropharynx as per pre-operative assessment       Right Infraclavicular block image

## 2016-03-17 NOTE — Anesthesia Postprocedure Evaluation (Signed)
Anesthesia Post Note  Patient: Luke Vazquez  Procedure(s) Performed: Procedure(s) (LRB): OPEN REDUCTION INTERNAL FIXATION (ORIF) RIGHT DISTAL RADIAL FRACTURE (Right)  Patient location during evaluation: PACU Anesthesia Type: General Level of consciousness: awake and alert Pain management: pain level controlled Vital Signs Assessment: post-procedure vital signs reviewed and stable Respiratory status: spontaneous breathing, nonlabored ventilation and respiratory function stable Cardiovascular status: blood pressure returned to baseline and stable Postop Assessment: no signs of nausea or vomiting Anesthetic complications: no    Last Vitals:  Filed Vitals:   03/17/16 1330 03/17/16 1401  BP: 137/90 136/98  Pulse: 79 77  Temp:  36.6 C  Resp: 18 18    Last Pain:  Filed Vitals:   03/17/16 1403  PainSc: 0-No pain                 [name] A

## 2016-03-18 ENCOUNTER — Encounter (HOSPITAL_BASED_OUTPATIENT_CLINIC_OR_DEPARTMENT_OTHER): Payer: Self-pay | Admitting: Orthopedic Surgery
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
## 2016-04-19 DIAGNOSIS — F17210 Nicotine dependence, cigarettes, uncomplicated: Secondary | ICD-10-CM | POA: Insufficient documentation

## 2016-12-16 DIAGNOSIS — B009 Herpesviral infection, unspecified: Secondary | ICD-10-CM | POA: Insufficient documentation

## 2017-05-11 DIAGNOSIS — S0990XA Unspecified injury of head, initial encounter: Secondary | ICD-10-CM | POA: Insufficient documentation

## 2017-06-07 DIAGNOSIS — R369 Urethral discharge, unspecified: Secondary | ICD-10-CM | POA: Insufficient documentation

## 2017-09-10 DIAGNOSIS — F4322 Adjustment disorder with anxiety: Secondary | ICD-10-CM | POA: Insufficient documentation

## 2018-01-16 ENCOUNTER — Ambulatory Visit: Payer: BLUE CROSS/BLUE SHIELD | Admitting: Diagnostic Neuroimaging

## 2018-03-15 ENCOUNTER — Ambulatory Visit: Payer: BLUE CROSS/BLUE SHIELD | Admitting: Neurology

## 2018-03-15 ENCOUNTER — Encounter: Payer: Self-pay | Admitting: Neurology

## 2018-03-15 ENCOUNTER — Other Ambulatory Visit: Payer: Self-pay

## 2018-03-15 VITALS — BP 124/82 | HR 85 | Ht 70.0 in | Wt 171.0 lb

## 2018-03-15 DIAGNOSIS — G40909 Epilepsy, unspecified, not intractable, without status epilepticus: Secondary | ICD-10-CM | POA: Diagnosis not present

## 2018-03-15 DIAGNOSIS — Z5181 Encounter for therapeutic drug level monitoring: Secondary | ICD-10-CM

## 2018-03-15 HISTORY — DX: Epilepsy, unspecified, not intractable, without status epilepticus: G40.909

## 2018-03-15 MED ORDER — LAMOTRIGINE 200 MG PO TABS: 200.0000 mg | ORAL_TABLET | Freq: Two times a day (BID) | ORAL | Status: DC

## 2018-03-15 MED ORDER — PREGABALIN 200 MG PO CAPS: 200.0000 mg | ORAL_CAPSULE | Freq: Two times a day (BID) | ORAL | 3 refills | Status: DC

## 2018-03-15 NOTE — Progress Notes (Signed)
Reason for visit: Seizures  Referring physician: Dr. Harrington Challenger is a 37 y.o. male  History of present illness:  Luke Vazquez is a 37 year old right-handed white male with a history of seizures since age 71.  The patient does give a history of multiple concussions throughout his life, he has not had any episodes of loss of consciousness.  There is no family history of seizures.  The patient indicates that his last seizure was about 5 or 6 years ago.  He last had MRI of the brain done in 2015, he claims that this study was unremarkable.  He has had EEG evaluations that were normal.  He claims that his seizures are associated with sudden onset of brief loss of consciousness without stiffening or jerking, he does not have tongue biting or problems losing control of the bowels or the bladder.  He will be unconscious for about 30 seconds and then come to.  The patient reports no nausea or diaphoresis with these events.  The patient denies any focal numbness or weakness of the face, arms, legs.  He does operate a Librarian, academic, he sleeps fairly well at night.  He is involved with a lot of extreme sports such as dirt bike riding.  He has been seen by Dr. Dayton Lions and Dr. Dwaine Gale in the past, he comes to this office for an evaluation.  He is on Lyrica, Vimpat, and Lamictal for seizures.  He takes clonazepam as needed for anxiety.  He claims that recently he has had thought intrusions about potential suicide, he otherwise does not feel depressed or withdrawn.  He looks forward to doing things day-to-day.  Past Medical History:  Diagnosis Date  . Abrasion of right side of back 03/12/2016  . Dental crowns present   . Depression   . Distal radius fracture, right 03/12/2016  . Seizure disorder (HCC) 03/15/2018  . Seizures (HCC)    last seizure > 1 yr. ago  . Shoulder separation 03/12/2016   left    Past Surgical History:  Procedure Laterality Date  . OPEN REDUCTION INTERNAL FIXATION  (ORIF) DISTAL RADIAL FRACTURE Right 03/17/2016   Procedure: OPEN REDUCTION INTERNAL FIXATION (ORIF) RIGHT DISTAL RADIAL FRACTURE;  Surgeon: Betha Loa, MD;  Location: [facility];  Service: Orthopedics;  Laterality: Right;  . WISDOM TOOTH EXTRACTION      History reviewed. No pertinent family history.  Social history:  reports that he has been smoking cigarettes.  He has a 20.00 pack-year smoking history. He has never used smokeless tobacco. He reports that he does not drink alcohol or use drugs.  Medications:  Prior to Admission medications   Medication Sig Start Date End Date Taking? Authorizing Provider  clonazePAM (KLONOPIN) 1 MG tablet Take 1 mg by mouth as needed. 05/11/17  Yes [provider]  lacosamide (VIMPAT) 200 MG TABS tablet Take 200 mg by mouth 2 (two) times daily.   Yes [provider]  valACYclovir (VALTREX) 500 MG tablet Take 1 tablet by mouth daily. 12/12/16  Yes [provider]  lamoTRIgine (LAMICTAL) 200 MG tablet Take 1 tablet (200 mg total) by mouth 2 (two) times daily. 03/15/18   York Spaniel, MD  pregabalin (LYRICA) 200 MG capsule Take 1 capsule (200 mg total) by mouth 2 (two) times daily. 03/15/18   York Spaniel, MD      Allergies  Allergen Reactions  . Phenergan [Promethazine Hcl] Nausea And Vomiting    ROS:  Out of a complete 14 system review of symptoms, the patient complains only of the following symptoms, and all other reviewed systems are negative.  Seizures Anxiety, suicidal thoughts  Blood pressure 124/82, pulse 85, height  (1.778 m), weight 171 lb (77.6 kg).  Physical Exam  General: The patient is alert and cooperative at the time of the examination.  Eyes: Pupils are equal, round, and reactive to light. Discs are flat bilaterally.  Neck: The neck is supple, no carotid bruits are noted.  Respiratory: The respiratory examination is clear.  Cardiovascular: The cardiovascular examination  reveals a regular rate and rhythm, no obvious murmurs or rubs are noted.  Skin: Extremities are without significant edema.  Neurologic Exam  Mental status: The patient is alert and oriented x 3 at the time of the examination. The patient has apparent normal recent and remote memory, with an apparently normal attention span and concentration ability.  Cranial nerves: Facial symmetry is present. There is good sensation of the face to pinprick and soft touch bilaterally. The strength of the facial muscles and the muscles to head turning and shoulder shrug are normal bilaterally. Speech is well enunciated, no aphasia or dysarthria is noted. Extraocular movements are full. Visual fields are full. The tongue is midline, and the patient has symmetric elevation of the soft palate. No obvious hearing deficits are noted.  Motor: The motor testing reveals 5 over 5 strength of all 4 extremities. Good symmetric motor tone is noted throughout.  Sensory: Sensory testing is intact to pinprick, soft touch, vibration sensation, and position sense on all 4 extremities. No evidence of extinction is noted.  Coordination: Cerebellar testing reveals good finger-nose-finger and heel-to-shin bilaterally.  Gait and station: Gait is normal. Tandem gait is normal. Romberg is negative. No drift is seen.  Reflexes: Deep tendon reflexes are symmetric and normal bilaterally. Toes are downgoing bilaterally.   Assessment/Plan:  1.  History of seizures  2.  Reports of suicidal ideation  The patient has unusual seizure type events, the patient has drop attacks with brief loss of consciousness and rapid regain of normal mental status.  The patient denies any history of palpitations of the heart, I am not clear that he has had a prolonged cardiac monitor study to exclude a cardiac rhythm problem.  The patient is having suicidal ideation without other symptoms of depression, this could be related to use of anticonvulsant  medications.  The Lyrica dose will be reduced to 500 mg daily for 2 weeks and then go to 400 mg daily.  A prescription was sent in.  He will remain on Vimpat and Lamictal for now, we will check blood work today.  He will follow-up in 4 months.  He is instructed to go to the emergency room if the intrusive thoughts worsen.  Marlan Palau MD 03/15/2018 9:08 AM  [facility] [address] [place], [place] [address]  Phone [phone] Fax [phone]

## 2018-03-15 NOTE — Patient Instructions (Signed)
For 2 weeks, begin Lyrica 300 mg in the morning and 200 mg in the evening for 2 weeks, then begin 200 mg twice a day.

## 2018-03-21 ENCOUNTER — Telehealth: Payer: Self-pay | Admitting: Neurology

## 2018-03-21 ENCOUNTER — Telehealth: Payer: Self-pay | Admitting: *Deleted

## 2018-03-21 DIAGNOSIS — R45851 Suicidal ideations: Secondary | ICD-10-CM

## 2018-03-21 LAB — LAMOTRIGINE LEVEL: Lamotrigine Lvl: 7 ug/mL (ref 2.0–20.0)

## 2018-03-21 LAB — CBC WITH DIFFERENTIAL/PLATELET
Basophils Absolute: 0 10*3/uL (ref 0.0–0.2)
Basos: 0 %
EOS (ABSOLUTE): 0.3 10*3/uL (ref 0.0–0.4)
EOS: 2 %
HEMATOCRIT: 50.4 % (ref 37.5–51.0)
HEMOGLOBIN: 16.9 g/dL (ref 13.0–17.7)
IMMATURE GRANS (ABS): 0 10*3/uL (ref 0.0–0.1)
Immature Granulocytes: 0 %
LYMPHS ABS: 1.8 10*3/uL (ref 0.7–3.1)
Lymphs: 17 %
MCH: 32.7 pg (ref 26.6–33.0)
MCHC: 33.5 g/dL (ref 31.5–35.7)
MCV: 98 fL — ABNORMAL HIGH (ref 79–97)
MONOCYTES: 8 %
Monocytes Absolute: 0.9 10*3/uL (ref 0.1–0.9)
NEUTROS ABS: 7.9 10*3/uL — AB (ref 1.4–7.0)
Neutrophils: 73 %
Platelets: 305 10*3/uL (ref 150–379)
RBC: 5.17 x10E6/uL (ref 4.14–5.80)
RDW: 14.2 % (ref 12.3–15.4)
WBC: 10.9 10*3/uL — AB (ref 3.4–10.8)

## 2018-03-21 LAB — COMPREHENSIVE METABOLIC PANEL
ALBUMIN: 4.5 g/dL (ref 3.5–5.5)
ALT: 16 IU/L (ref 0–44)
AST: 15 IU/L (ref 0–40)
Albumin/Globulin Ratio: 1.7 (ref 1.2–2.2)
Alkaline Phosphatase: 109 IU/L (ref 39–117)
BUN / CREAT RATIO: 10 (ref 9–20)
BUN: 10 mg/dL (ref 6–20)
Bilirubin Total: 0.4 mg/dL (ref 0.0–1.2)
CO2: 24 mmol/L (ref 20–29)
CREATININE: 1.04 mg/dL (ref 0.76–1.27)
Calcium: 9.3 mg/dL (ref 8.7–10.2)
Chloride: 105 mmol/L (ref 96–106)
GFR calc non Af Amer: 91 mL/min/{1.73_m2} (ref >59)
GFR, EST AFRICAN AMERICAN: 105 mL/min/{1.73_m2} (ref >59)
GLUCOSE: 76 mg/dL (ref 65–99)
Globulin, Total: 2.6 g/dL (ref 1.5–4.5)
Potassium: 4.6 mmol/L (ref 3.5–5.2)
Sodium: 143 mmol/L (ref 134–144)
TOTAL PROTEIN: 7.1 g/dL (ref 6.0–8.5)

## 2018-03-21 LAB — LACOSAMIDE: LACOSAMIDE: 16 ug/mL — AB (ref 5.0–10.0)

## 2018-03-21 MED ORDER — ESCITALOPRAM OXALATE 10 MG PO TABS
10.0000 mg | ORAL_TABLET | Freq: Every day | ORAL | 3 refills | Status: DC
Start: 2018-03-21 — End: 2018-03-23

## 2018-03-21 NOTE — Telephone Encounter (Signed)
I called the patient.  The patient continues to have intrusive thoughts of suicide, he again indicates that he does not believe that he will do this, but he still gets the thoughts.  I will call in Lexapro 10 mg daily, I will get him referred to psychiatry.  If he feels that things are changing, he needs to go to the emergency room on an emergency basis.

## 2018-03-21 NOTE — Telephone Encounter (Signed)
-----   Message from York Spaniel, MD sent at 03/21/2018 12:26 PM EDT ----- Blood work done shows a normal chemistry profile and liver profile.  There appears to be a chronic mildly elevated white blood count, unchanged.  Keppra level is therapeutic, the Vimpat level as a random level is somewhat elevated, would not change dosing at this time. Please call the patient. ----- Message ----- From: Nell Range Lab Results In Sent: 03/16/2018   7:41 AM To: York Spaniel, MD

## 2018-03-21 NOTE — Telephone Encounter (Signed)
Pt called said his pregabalin (LYRICA) 200 MG capsule was cut he is having suicidal thoughts. Pt is at work, he sounds very good, he says he is not going to, doesn't feel like he will, no reason to but he was advised to call if this occurred. RN was skyped, she was unable to talk with pt at this time. Please call asap

## 2018-03-21 NOTE — Telephone Encounter (Signed)
Called, LVM for pt about lab results per CW,MD note. Gave [facility] phone number if he has further questions/concerns.

## 2018-03-21 NOTE — Telephone Encounter (Signed)
Called patient. He states he is at work right now. He is having suicidal thoughts but denies wanting to hurt himself or others. Has not worsened since he was seen on 03/15/18 by Dr. Anne Hahn. I did advise if these thoughts worsen he should proceed to ED ASAP. He assured me that he was not going to act on thoughts. He wants to know if Dr. Anne Hahn can start him on an antidepressant. He has never been on any in the past but willing to try one.

## 2018-03-23 ENCOUNTER — Telehealth: Payer: Self-pay | Admitting: *Deleted

## 2018-03-23 NOTE — Telephone Encounter (Signed)
Took call from phone staff. Pt said he felt lexapro made suicidal thoughts worse after about an hour and a half from taking medication. He went to ED and they ran blood work and d/c'd him. They gave him number to psychiatrist to contact and set up appt. He has not contact them yet. He just woke up. He got home late from ED. I recommended he contact this am. Dr. Anne Hahn out of the office. I will speak w/ [facility] to discuss if they have further recommendations and call him back. He verbalized understanding.

## 2018-03-23 NOTE — Telephone Encounter (Signed)
Spoke w/ Dr. Vickey Huger who also recommended he call number provided by ED for psychiatry. He should also involve family/friends for support system. Dr. Anne Hahn referred him to Dr. Donell Beers. I will provide this to the pt as well.

## 2018-03-23 NOTE — Addendum Note (Signed)
Addended by: Eilene Ghazi L on: 03/23/2018 10:14 AM   Modules accepted: Orders

## 2018-03-23 NOTE — Telephone Encounter (Addendum)
I called Dr. Caprice Renshaw office. Spoke w/ Adair Laundry. Asked to speak with office manager, Ms. Clovis Riley but she was not available. Advised we were trying to expedite pt referral d/t suicidal ideation and high anxiety. Pt went to ED yesterday. They stated they spoke w/ pt yesterday and gave him registration phone number to call: [phone].  Advised pt did not make me aware. I will call pt back.  I called pt and relayed above information. He verified he spoke w/ their office while he was at work. He did not get a chance to call because he ended up going to the ED. I recommended he call them now to get set up. If its going to be a few weeks, recommended he go to walk in clinic if he feels he needs to be seen asap.  Also recommended he have friends/family to stay with during this time until he can get established with psychiatrist. He stayed with his parents last night and is going to stay with them tonight. He will call back if he needs anything further. He denied that he was going to act on suicidal thoughts. He stopped the lexapro.

## 2018-03-27 ENCOUNTER — Ambulatory Visit (HOSPITAL_COMMUNITY)
Admission: RE | Admit: 2018-03-27 | Discharge: 2018-03-27 | Disposition: A | Discharge status: Home / Self Care | Payer: BLUE CROSS/BLUE SHIELD | Attending: Psychiatry | Admitting: Psychiatry

## 2018-03-27 NOTE — BH Assessment (Signed)
Assessment Note  Luke Vazquez is an 37 y.o. male. Pt presents to  [facility] as walk in referred by [facility].  Pt recently had to begin seeing a new neurologist who made several medication changes, which have led to pt being in distress.  Pt denies depression but reports he has had some vague SI over the past weeks.  Pt reported this to his new neurologist 10 days ago and this MD decreased his lyrica dose from 676m to 5022mand also had pt begin taking lexapro about a week after the initial appt.  Pt reports he took one dose of lexapro last Thursday and states that is "amplified every thought I have." Pt reports he is going to work and functioning well.  (This is confirmed by coworker who accompanied pt and was present for TTS assessment at pt request)  Pt reports when he sits down at the end of the day, his thoughts start racing.  Pt has also continued to have some SI but denies any plan or intent.  Pt reports he was having sleep problems, only 4 hours per night, but that he takes klonapin 100m24mnd has been able to sleep fine that way.  Pt denies HI. Pt denies AV.  Pt drinks alcohol about 2x per week, 4-5 beers but has not had any alcohol since this began.  No prior psych history or treatment of any kind.  Pt was referred to [facility] by the neurologist and was able to get an appt 04/23/18 as of today.  When TTS discussed possible admission, pt did not want to be admitted.  Pt "felt like he needed to talk to someone" and [facility] told him that if he needed to see someone immediately[facility] that he is safe and does not want to harm himself.  He asked if he could return if he needed to and was told that he should return if SI persisted or worsened.   Diagnosis: deferred  Past Medical History:  Past Medical History:  Diagnosis Date  . Abrasion of right side of back 03/12/2016  . Dental crowns present   . Depression   . Distal radius fracture, right 03/12/2016   . [place] ([place]  . [place]izures ([place]  last seizure > 1 yr. ago  . Shoulder separation 03/12/2016   left    Past Surgical History:  Procedure Laterality Date  . OPEN REDUCTION INTERNAL FIXATION (ORIF) DISTAL RADIAL FRACTURE Right 03/17/2016   Procedure: OPEN REDUCTION INTERNAL FIXATION (ORIF) RIGHT DISTAL RADIAL FRACTURE;  Surgeon: KevLeanora CoverD;  [facility];  Laterality: Right;  . WISDOM TOOTH EXTRACTION      Family History: No family history on file.  Social History:  reports that he has been smoking cigarettes.  He has a 20.00 pack-year smoking history. He has never used smokeless tobacco. He reports that he does not drink alcohol or use drugs.  Additional Social History:  Alcohol / Drug Use Pain Medications: Pt denies Prescriptions: Pt denies Over the Counter: Pt denies History of alcohol / drug use?: Yes Negative Consequences of Use: (S) (none reported) Withdrawal Symptoms: (none reported) Substance #1 Name of Substance 1: beer 1 - Amount (size/oz): 4-5 12 oz beers 1 - Frequency: 2x week  CIWA: CIWA-Ar BP: (!) 123/93 Pulse Rate: 97 COWS:    Allergies:  Allergies  Allergen Reactions  . Phenergan [Promethazine Hcl] Nausea And Vomiting  .  Lexapro [Escitalopram Oxalate]     Increased suicidal ideation    Home Medications:  (Not in a hospital admission)  OB/GYN Status:  No LMP for male patient.  General Assessment Data Location of Assessment: [facility] Assessment Services TTS Assessment: In system Is this a Tele or Face-to-Face Assessment?: Face-to-Face Is this an Initial Assessment or a Re-assessment for this encounter?: Initial Assessment Marital status: Single Is patient pregnant?: No Living Arrangements: Alone Can pt return to current living arrangement?: Yes Referral Source: Psychiatrist  Medical Screening Exam ([place]) Medical Exam completed: Yes  Crisis Care Plan Living  Arrangements: Alone Name of Psychiatrist: [facility](first appt 04/23/18) Name of Therapist: none     Risk to self with the past 6 months Suicidal Ideation: Yes-Currently Present Has patient been a risk to self within the past 6 months prior to admission? : No Suicidal Intent: No Has patient had any suicidal intent within the past 6 months prior to admission? : No Is patient at risk for suicide?: No Suicidal Plan?: No Has patient had any suicidal plan within the past 6 months prior to admission? : No Access to Means: No What has been your use of drugs/alcohol within the last 12 months?: some alcohol use Previous Attempts/Gestures: No Intentional Self Injurious Behavior: None Family Suicide History: No Recent stressful life event(s): (none reported) Persecutory voices/beliefs?: No Depression: No Substance abuse history and/or treatment for substance abuse?: No Suicide prevention information given to non-admitted patients: Yes  Risk to Others within the past 6 months Homicidal Ideation: No Does patient have any lifetime risk of violence toward others beyond the six months prior to admission? : No Thoughts of Harm to Others: No Current Homicidal Intent: No Current Homicidal Plan: No Access to Homicidal Means: No History of harm to others?: No Assessment of Violence: None Noted Does patient have access to weapons?: No(Pt gave guns to his friend this week.) Criminal Charges Pending?: No Does patient have a court date: No Is patient on probation?: No  Psychosis Hallucinations: None noted Delusions: None noted  Mental Status Report Appearance/Hygiene: Unremarkable Eye Contact: Good Motor Activity: Unremarkable Speech: Logical/coherent Level of Consciousness: Alert Mood: Pleasant Affect: Appropriate to circumstance Anxiety Level: Minimal Thought Processes: Coherent, Relevant Judgement: Unimpaired Orientation: Person, Place, Time, Situation Obsessive Compulsive  Thoughts/Behaviors: None  Cognitive Functioning Concentration: Normal Memory: Recent Intact, Remote Intact Is patient IDD: No Is patient DD?: No Insight: Good Impulse Control: Good Appetite: Fair Have you had any weight changes? : No Change Sleep: Decreased Total Hours of Sleep: 4(Pt has been able to sleep well with klonapan) Vegetative Symptoms: None  ADLScreening [facility] Assessment Services) Patient's cognitive ability adequate to safely complete daily activities?: Yes Patient able to express need for assistance with ADLs?: Yes Independently performs ADLs?: Yes (appropriate for developmental age)  Prior Inpatient Therapy Prior Inpatient Therapy: No  Prior Outpatient Therapy Prior Outpatient Therapy: No Does patient have an ACCT team?: No Does patient have Intensive In-House Services?  : No Does patient have [facility] services? : No Does patient have P4CC services?: No  ADL Screening (condition at time of admission) Patient's cognitive ability adequate to safely complete daily activities?: Yes Patient able to express need for assistance with ADLs?: Yes Independently performs ADLs?: Yes (appropriate for developmental age)       Abuse/Neglect Assessment (Assessment to be complete while patient is alone) Abuse/Neglect Assessment Can Be Completed: Yes Physical Abuse: Denies Verbal Abuse: Denies Sexual Abuse: Denies Exploitation of patient/patient's resources: Denies  Advance Directives (For Healthcare) Does Patient Have a Medical Advance Directive?: No Would patient like information on creating a medical advance directive?: No - Patient declined    Additional Information 1:1 In Past 12 Months?: No CIRT Risk: No Elopement Risk: No Does patient have medical clearance?: Yes     Disposition: TTS spoke to [facility], NP, who met with pt, also discussed safety plan, and agreed to discharge pt.  Pt is interested in starting therapy.  He is going to call [facility] to  schedule there.  Pt was also given list of other outpt providers as well. Disposition Initial Assessment Completed for this Encounter: Yes Disposition of Patient: Discharge Patient refused recommended treatment: No Mode of transportation if patient is discharged?: Car Patient referred to: Other (Comment)(Pt has appt with [facility].  List of outpt resources)  On Site Evaluation by:   Reviewed with Physician:    Joanne Chars 03/27/2018 3:46 PM

## 2018-03-27 NOTE — H&P (Signed)
Behavioral Health Medical Screening Exam  Luke Vazquez is an 37 y.o. male.  Total Time spent with patient: 20 minutes  Psychiatric Specialty Exam: Physical Exam  Nursing note and vitals reviewed. Constitutional: He is oriented to person, place, and time. He appears well-developed and well-nourished.  Cardiovascular: Normal rate.  Respiratory: Effort normal.  Musculoskeletal: Normal range of motion.  Neurological: He is alert and oriented to person, place, and time.  Skin: Skin is warm.    Review of Systems  Constitutional: Negative.   HENT: Negative.   Eyes: Negative.   Respiratory: Negative.   Cardiovascular: Negative.   Gastrointestinal: Negative.   Genitourinary: Negative.   Musculoskeletal: Negative.   Skin: Negative.   Neurological: Negative.   Endo/Heme/Allergies: Negative.   Psychiatric/Behavioral: Positive for depression. Negative for suicidal ideas (intermittent SI none currently).    Blood pressure (!) 123/93, pulse 97, temperature 98.3 F (36.8 C), SpO2 98 %.There is no height or weight on file to calculate BMI.  General Appearance: Casual  Eye Contact:  Good  Speech:  Clear and Coherent and Normal Rate  Volume:  Normal  Mood:  Anxious  Affect:  Congruent  Thought Process:  Goal Directed and Descriptions of Associations: Intact  Orientation:  Full (Time, Place, and Person)  Thought Content:  WDL  Suicidal Thoughts:  No  Homicidal Thoughts:  No  Memory:  Immediate;   Good Recent;   Good Remote;   Good  Judgement:  Fair  Insight:  Fair  Psychomotor Activity:  Normal  Concentration: Concentration: Good and Attention Span: Good  Recall:  Good  Fund of Knowledge:Good  Language: Good  Akathisia:  No  Handed:  Right  AIMS (if indicated):     Assets:  Communication Skills Desire for Improvement Financial Resources/Insurance Housing Physical Health Social Support Transportation  Sleep:       Musculoskeletal: Strength & Muscle Tone: within  normal limits Gait & Station: normal Patient leans: Right  Blood pressure (!) 123/93, pulse 97, temperature 98.3 F (36.8 C), SpO2 98 %.  Recommendations:  Based on my evaluation the patient does not appear to have an emergency medical condition.  Maryfrances Bunnell, FNP 03/27/2018, 3:39 PM

## 2018-03-29 ENCOUNTER — Telehealth: Payer: Self-pay | Admitting: Neurology

## 2018-03-29 MED ORDER — ARIPIPRAZOLE 2 MG PO TABS
2.0000 mg | ORAL_TABLET | Freq: Every day | ORAL | 1 refills | Status: DC
Start: 2018-03-29 — End: 2018-08-24

## 2018-03-29 NOTE — Telephone Encounter (Signed)
Pt called said he wanted to talk about effects to lamoTRIgine (LAMICTAL) 200 MG tablet. Please call to advise

## 2018-03-29 NOTE — Telephone Encounter (Signed)
I called the patient.  The patient could not tolerate the Lexapro, it had a paradoxical response with the thoughts of suicidal ideation.  The patient has an appointment to see psychiatry on 23 April 2018.  I will start Abilify now, 2 mg at night.  The patient will go down on the Lyrica taking 400 mg daily.

## 2018-04-02 ENCOUNTER — Telehealth: Payer: Self-pay | Admitting: Neurology

## 2018-04-02 MED ORDER — LACOSAMIDE 200 MG PO TABS: 200.0000 mg | ORAL_TABLET | Freq: Two times a day (BID) | ORAL | 1 refills | Status: DC

## 2018-04-02 NOTE — Telephone Encounter (Signed)
Pt request refill for lacosamide (VIMPAT) 200 MG TABS tablet sent to [facility]. Pt said per the pharmacy he is not due to get this until Thursday but he will be out tomorrow.

## 2018-04-02 NOTE — Telephone Encounter (Signed)
The prescription for Vimpat was sent in. 

## 2018-05-14 ENCOUNTER — Other Ambulatory Visit: Payer: Self-pay | Admitting: Neurology

## 2018-05-28 ENCOUNTER — Other Ambulatory Visit: Payer: Self-pay | Admitting: Neurology

## 2018-05-29 ENCOUNTER — Other Ambulatory Visit: Payer: Self-pay | Admitting: Neurology

## 2018-07-16 ENCOUNTER — Other Ambulatory Visit: Payer: Self-pay | Admitting: Neurology

## 2018-07-23 ENCOUNTER — Telehealth: Payer: Self-pay | Admitting: Neurology

## 2018-07-23 MED ORDER — PREGABALIN 200 MG PO CAPS: ORAL_CAPSULE | ORAL | 1 refills | Status: DC

## 2018-07-23 NOTE — Addendum Note (Signed)
Addended by: York SpanielWILLIS, CHARLES K on: 07/23/2018 10:27 AM   Modules accepted: Orders

## 2018-07-23 NOTE — Telephone Encounter (Signed)
We will try to get the patient on brand-name Lyrica.

## 2018-07-23 NOTE — Telephone Encounter (Signed)
Patient calling to discuss getting brand name of pregabalin (LYRICA) 200 MG capsule instead of generic. He uses [facility].

## 2018-07-23 NOTE — Telephone Encounter (Signed)
Called patient back. He states he feels generic Lyrica not as effective. He feels it wears off throughout the day. Only effective for about "quarter of the day" per pt.  He is requesting to take name brand Lyrica. He has been on this in the past and it was more effective. He states he is willing to pay more for name brand if needed.  Advised if Dr. Anne HahnWillis authorizes change, it may require PA via insurance. He is aware.

## 2018-07-26 MED ORDER — LAMOTRIGINE 200 MG PO TABS: 200.0000 mg | ORAL_TABLET | Freq: Two times a day (BID) | ORAL | 1 refills | Status: DC

## 2018-07-26 NOTE — Addendum Note (Signed)
Addended by: York Spaniel on: 07/26/2018 02:20 PM   Modules accepted: Orders

## 2018-07-26 NOTE — Telephone Encounter (Signed)
Pt requesting a call to discuss medication Lyrica, did not wish to discuss further

## 2018-07-26 NOTE — Telephone Encounter (Signed)
I called the patient.  The generic Lyrica is not effective, he is wearing off.  I have heard this from several other patients as well.  I will send in a prescription for brand-name Lyrica, if he is unable to get brand-name Lyrica, we may have to go up on the dose of the generic.

## 2018-08-24 ENCOUNTER — Other Ambulatory Visit: Payer: Self-pay

## 2018-08-24 ENCOUNTER — Telehealth: Payer: Self-pay | Admitting: Neurology

## 2018-08-24 ENCOUNTER — Encounter: Payer: Self-pay | Admitting: Neurology

## 2018-08-24 ENCOUNTER — Ambulatory Visit: Payer: BLUE CROSS/BLUE SHIELD | Admitting: Neurology

## 2018-08-24 VITALS — BP 118/78 | HR 88 | Ht 70.0 in | Wt 174.0 lb

## 2018-08-24 DIAGNOSIS — G40909 Epilepsy, unspecified, not intractable, without status epilepticus: Secondary | ICD-10-CM

## 2018-08-24 MED ORDER — PREGABALIN 200 MG PO CAPS: ORAL_CAPSULE | ORAL | 1 refills | Status: DC

## 2018-08-24 NOTE — Telephone Encounter (Signed)
I called the pharmacy and the patient will receive the brand name Lyrica prescription.

## 2018-08-24 NOTE — Telephone Encounter (Signed)
Pt called stating that Dr. Anne Hahn or RN will need to call [facility] and speak with Johna Sheriff to discuss and early refill for Lyrica

## 2018-08-24 NOTE — Progress Notes (Signed)
Reason for visit: Seizures  Luke Vazquez is an 37 y.o. male  History of present illness:  Luke Vazquez is a 37 year old right-handed white male with a history of a seizure disorder that has been well controlled, he has not had a seizure in greater than 6 years.  The patient is on Vimpat, Lyrica, and Lamictal.  He tolerates these medications fairly well, he has had significant issues recently with suicidal ideation, he is now being followed through psychiatry.  He does very well on low-dose Zoloft taking 12.5 mg daily.  He could not tolerate higher doses.  The patient feels a wearing off effect with generic Lyrica, he gets a tingling sensation in the back of the head and spine, he wishes to remain on brand-name Lyrica.  He is operating a motor vehicle, he does actively work.  Past Medical History:  Diagnosis Date  . Abrasion of right side of back 03/12/2016  . Dental crowns present   . Depression   . Distal radius fracture, right 03/12/2016  . Seizure disorder (HCC) 03/15/2018  . Seizures (HCC)    last seizure > 1 yr. ago  . Shoulder separation 03/12/2016   left    Past Surgical History:  Procedure Laterality Date  . OPEN REDUCTION INTERNAL FIXATION (ORIF) DISTAL RADIAL FRACTURE Right 03/17/2016   Procedure: OPEN REDUCTION INTERNAL FIXATION (ORIF) RIGHT DISTAL RADIAL FRACTURE;  Surgeon: Betha Loa, MD;  Location: [facility];  Service: Orthopedics;  Laterality: Right;  . WISDOM TOOTH EXTRACTION      History reviewed. No pertinent family history.  Social history:  reports that he has been smoking cigarettes. He has a 20.00 pack-year smoking history. He has never used smokeless tobacco. He reports that he does not drink alcohol or use drugs.    Allergies  Allergen Reactions  . Phenergan [Promethazine Hcl] Nausea And Vomiting  . Lexapro [Escitalopram Oxalate]     Increased suicidal ideation    Medications:  Prior to Admission medications   Medication Sig  Start Date End Date Taking? Authorizing Provider  lacosamide (VIMPAT) 200 MG TABS tablet Take 1 tablet (200 mg total) by mouth 2 (two) times daily. 04/02/18  Yes York Spaniel, MD  lamoTRIgine (LAMICTAL) 200 MG tablet Take 1 tablet (200 mg total) by mouth 2 (two) times daily. Brand medication is medically necessary. 07/26/18  Yes York Spaniel, MD  pregabalin (LYRICA) 200 MG capsule TAKE 1 CAPSULE BY MOUTH 2 TIMES DAILY 08/24/18  Yes York Spaniel, MD  sertraline (ZOLOFT) 25 MG tablet Take 12.5 mg by mouth daily.   Yes [provider]  valACYclovir (VALTREX) 500 MG tablet Take 1 tablet by mouth daily. 12/12/16  Yes [provider]    ROS:  Out of a complete 14 system review of symptoms, the patient complains only of the following symptoms, and all other reviewed systems are negative.  History of seizures  Blood pressure 118/78, pulse 88, height 5\' 10"  (1.778 m), weight 174 lb (78.9 kg).  Physical Exam  General: The patient is alert and cooperative at the time of the examination.  Skin: No significant peripheral edema is noted.   Neurologic Exam  Mental status: The patient is alert and oriented x 3 at the time of the examination. The patient has apparent normal recent and remote memory, with an apparently normal attention span and concentration ability.   Cranial nerves: Facial symmetry is present. Speech is normal, no aphasia or dysarthria is noted. Extraocular movements  are full. Visual fields are full.  Motor: The patient has good strength in all 4 extremities.  Sensory examination: Soft touch sensation is symmetric on the face, arms, and legs.  Coordination: The patient has good finger-nose-finger and heel-to-shin bilaterally.  Gait and station: The patient has a normal gait. Tandem gait is normal. Romberg is negative. No drift is seen.  Reflexes: Deep tendon reflexes are symmetric.   Assessment/Plan:  1.  History of seizures, well  controlled  2.  Depression, suicidal ideation  The patient will be given a prescription for the brand name Lyrica today.  He will remain on Lamictal and Vimpat, he will follow-up in 6 months.  He will call for any dose adjustments.  Marlan Palau MD 08/24/2018 9:09 AM  [facility] [address] [place], [place] [address]  Phone [phone] Fax [phone]

## 2018-09-24 ENCOUNTER — Ambulatory Visit: Payer: BLUE CROSS/BLUE SHIELD | Admitting: Psychiatry

## 2018-09-25 ENCOUNTER — Other Ambulatory Visit: Payer: Self-pay | Admitting: Neurology

## 2018-10-25 ENCOUNTER — Telehealth: Payer: Self-pay | Admitting: Neurology

## 2018-10-25 NOTE — Telephone Encounter (Signed)
Pt is asking for a call from RN, he feels he is having a possible seizure

## 2018-10-25 NOTE — Telephone Encounter (Signed)
Chart reviewed, his seizure was on the excellent control based on last office note on August 24, 2018, he also has a history of disorder,  I will not change any of his antiepileptic medications now, above described symptoms seizure.  Agree see pcp or ER if symptoms worsened

## 2018-10-25 NOTE — Telephone Encounter (Signed)
I contacted the patient. He states since yesterday his hands and feet have been going numb. He also reports tingling throughout his whole body, feeling flushed and feels like he is dehydrated (patinet states he has been trying to consume as much fluids as he can). Pateint states he has not had a actual seizure, but feels like these symptoms may lead to an actual seizure.  I asked the patient if he had missed a dosage of his Lamictal 200 mg 1 tab bid or vimpat 200 mg 1 tab bid and patient denied missing medication. Also confirmed if the drug store had changed the coating/ packing and patient denied any changes at the pharmacy.   Patient was advised due to his symptoms he could seek care at the ED ([facility] or [facility] preferably). Patient was agreeable to this.   I advised I would send message to on call MD to see about possible changes in the medications. Patient states if medications changes are recommended he would prefer changes to the Lamictal dosage.

## 2018-11-09 ENCOUNTER — Other Ambulatory Visit: Payer: Self-pay | Admitting: Neurology

## 2018-11-09 NOTE — Telephone Encounter (Signed)
I called [facility]. Pt picked up a 3 month supply of lamictal today. I asked them to send a refill request closer to when he is due for his next refill. Pharmacist verbalized understanding.

## 2018-11-16 ENCOUNTER — Other Ambulatory Visit: Payer: Self-pay | Admitting: Neurology

## 2018-11-20 ENCOUNTER — Telehealth: Payer: Self-pay

## 2018-11-20 NOTE — Telephone Encounter (Signed)
PA submitted for Brand Name Lyrica to cover my meds. On 11/19/18. Dx code G69.909. Will follow determination on cover my meds.

## 2018-11-21 NOTE — Telephone Encounter (Signed)
Patient calling because he only has 1 and a half days of Lyrica left.  The generic does not work for him. Please call

## 2018-11-21 NOTE — Telephone Encounter (Signed)
I contacted the pt and advised PA for Brand name Lyrica is still pending approval through [facility], pt advised we would call once determination has been made.

## 2018-11-22 ENCOUNTER — Telehealth: Payer: Self-pay

## 2018-11-22 NOTE — Telephone Encounter (Signed)
Ariel from [facility] was giving [place] a courtesy call to let us know that PA has been approved.

## 2018-11-22 NOTE — Telephone Encounter (Signed)
Noted  

## 2018-11-22 NOTE — Telephone Encounter (Signed)
PA approved for Lyrica 200 mg. Effective dates 11/19/2018-11/17/2021. Ref # [id number]. Pt and [facility] notified of approval.

## 2019-01-14 ENCOUNTER — Other Ambulatory Visit: Payer: Self-pay | Admitting: Neurology

## 2019-01-14 NOTE — Telephone Encounter (Signed)
I called pt back. Informed him lyrica last sent 08/24/18 #180, 1 refill and vimpat last sent 11/16/18 #180, 1 refill to [facility]. He has only been getting 30 days at a time. He will call pharmacy to discuss further.

## 2019-01-14 NOTE — Telephone Encounter (Signed)
Pt is calling to find out if he will be able to get his VIMPAT 200 MG TABS tablet and his pregabalin (LYRICA) 200 MG capsule in a 3 month supply. Please advise.

## 2019-01-14 NOTE — Telephone Encounter (Signed)
I called pt back. Relayed below info. He will call insurance to verify he has deductible to meet. He will call back if any further questions/concerns.

## 2019-01-14 NOTE — Telephone Encounter (Signed)
Patient is calling in stating the pharmacy is only giving him 60 capsules of his Lyrica and he is paying roughly $500 a month for the med and isnt getting his full amount.

## 2019-01-14 NOTE — Telephone Encounter (Signed)
I called [facility]. Spoke with Jillyn Hidden (pharmacist). There was only 60 tabs left on prescription.  He has a copay of roughly 480.00. He may have deductible to meet with his insurance. He should contact insurance company to find this out. They are getting paid claim via insurance

## 2019-01-15 ENCOUNTER — Telehealth: Payer: Self-pay | Admitting: Neurology

## 2019-01-15 MED ORDER — LACOSAMIDE 200 MG PO TABS: 200.0000 mg | ORAL_TABLET | Freq: Two times a day (BID) | ORAL | 1 refills | Status: DC

## 2019-01-15 NOTE — Telephone Encounter (Signed)
I will call in a prescription for the Vimpat.

## 2019-01-15 NOTE — Telephone Encounter (Signed)
Pt requesting a refill for VIMPAT 200 MG TABS tablet sent to [facility]

## 2019-01-15 NOTE — Telephone Encounter (Signed)
[place] drug registry verified. Last refill was 12/24/18 # 60 for a 30 day supply provided by Dr. Anne Hahn. Pt has been compliant with f/u's.

## 2019-01-16 ENCOUNTER — Telehealth: Payer: Self-pay

## 2019-01-16 NOTE — Telephone Encounter (Signed)
[place] of brand name additional fees completed/signed by Dr. Anne Hahn.  Form faxed to [phone]. Confirmation received.

## 2019-01-21 NOTE — Telephone Encounter (Signed)
[place] for brand name lyrica was denied due to the pt not have an allergic/adverse effects to the brand name medication. Insurance has notified pt.

## 2019-01-24 ENCOUNTER — Other Ambulatory Visit: Payer: Self-pay

## 2019-02-11 ENCOUNTER — Other Ambulatory Visit: Payer: Self-pay | Admitting: Neurology

## 2019-02-13 NOTE — Telephone Encounter (Signed)
Pt has called back asking for a call from RN to discuss the appeal of the Lyrica and what he wants her to put in the letter re: how the generic does not help him. Please call

## 2019-02-14 NOTE — Telephone Encounter (Signed)
I called the pt and had an extended conversation with him. He is requesting an appeal letter to be sent to [facility] for brand name Lyrica.  Patient states within the letter it should state while he was take the generic form, Pregabalin he experienced shakiness, decreased efficacy, high blood pressure, hyperventilation and impulse to take more medication.  He also wanted the appeal to mention when he would use  Listerine it would make him feel as if he was going to have a seizure.  He requested this information be classified as an allergic/adverse reaction.   I advised I would formulate letter and provide to Dr. Anne Hahn to review/sign if appropriate.   Pt was also interested in apply for patient assistance for Lyrica. I have printed forms and given to Annabelle Harman C to speak and help guide the pt during this process.

## 2019-02-14 NOTE — Telephone Encounter (Signed)
Appeal letter has been faxed to [facility] appeals department. Fax # [phone]. Confirmation received.

## 2019-02-14 NOTE — Telephone Encounter (Signed)
The appeal letter was signed.  Clearly, there are differences between generic and brand name Lyrica, oftentimes the switch from brand name to generic results in worsening clinical symptoms, the generic dosing oftentimes needs to be increased.

## 2019-02-18 ENCOUNTER — Other Ambulatory Visit: Payer: Self-pay | Admitting: Neurology

## 2019-02-20 ENCOUNTER — Telehealth: Payer: Self-pay

## 2019-02-20 NOTE — Telephone Encounter (Signed)
Patient does not qualify for Patient assistance income is to high.

## 2019-02-20 NOTE — Telephone Encounter (Addendum)
I contacted the pt in regards to his 03/08/19 6 month f/u. Pt was advise, due to current COVID 19 pandemic, our office is severely reducing in office visits for at least the next 2 weeks, in order to minimize the risk to our patients and healthcare providers.   Pt was offered a video visit with Dr. Anne Hahn on 02/25/19 at 4 pm and accepted.   Pt understands that although there may be some limitations with this type of visit, we will take all precautions to reduce any security or privacy concerns.  Pt understands that this will be treated like an in office visit and we will file with pt's insurance, and there may be a patient responsible charge related to this service.  Pt's email is [email or web address]. Pt understands that the cisco webex software must be downloaded and operational on the device pt plans to use for the visit.  Pt's meds, allergies and PMH has been updated.

## 2019-02-20 NOTE — Telephone Encounter (Signed)
I contacted the pt in regards to his Lyrica Appeal.  I advised I had received a letter back stating the appeal had been received from Dr. Anne Hahn but in order for [facility] to proceed a written authorization was needed from the pt directly giving authorization for Dr. Anne Hahn to appeal the Lyrica.  The number on the letter for the pt to contact I received was [phone] and the individuals name is [name] (Resolution Coordinator and Member rights/appeals).   I provided this information to the pt and he was in agreement to calling. Pt will call back if he has any further questions.

## 2019-02-25 ENCOUNTER — Ambulatory Visit (INDEPENDENT_AMBULATORY_CARE_PROVIDER_SITE_OTHER): Payer: BLUE CROSS/BLUE SHIELD | Admitting: Neurology

## 2019-02-25 ENCOUNTER — Encounter: Payer: Self-pay | Admitting: Neurology

## 2019-02-25 ENCOUNTER — Other Ambulatory Visit: Payer: Self-pay

## 2019-02-25 DIAGNOSIS — G40909 Epilepsy, unspecified, not intractable, without status epilepticus: Secondary | ICD-10-CM

## 2019-02-25 DIAGNOSIS — Z5181 Encounter for therapeutic drug level monitoring: Secondary | ICD-10-CM

## 2019-02-25 NOTE — Progress Notes (Signed)
     Virtual Visit via Telephone Note  I connected with Luke Vazquez on 02/25/19 at  4:00 PM EDT by telephone and verified that I am speaking with the correct person using two identifiers.   I discussed the limitations, risks, security and privacy concerns of performing an evaluation and management service by telephone and the availability of in person appointments. I also discussed with the patient that there may be a patient responsible charge related to this service. The patient expressed understanding and agreed to proceed.  The patient is at home, physician at office.   History of Present Illness: Luke Vazquez is a 38 year old right-handed white male with a history of seizures.  He has been well controlled with the seizures, he remains on Lyrica, Vimpat, and Lamictal.  He has had some troubles with depression in the past but this is been better controlled, he remains on Zoloft 12.5 mg daily.  The patient has not been working much since the COVID pandemic.  He does operate a Librarian, academic.  He does have some slight tiredness on the medication, otherwise he is tolerating them well.  With the Lyrica, he needs brand name, when he went to generic, he had a severe wearing off effect of the drug, the brand name does not cause this issue.  He denies any other new medical issues since last seen.  He wishes to have a referral to a primary care physician.   Observations/Objective: WebEx evaluation reveals that the patient is bright and alert and cooperative.  Extraocular movements are full, speech is well enunciated, not aphasic.  He appears to be answering questions appropriately.  Assessment and Plan: 1.  Seizure disorder, well controlled  2.  Depression  The patient will continue his current medications.  We will check blood work, he will come in at his convenience.  I will make a referral to a primary care physician.  Follow Up Instructions: 1 year follow-up, may see nurse practitioner.   I discussed the assessment and treatment plan with the patient. The patient was provided an opportunity to ask questions and all were answered. The patient agreed with the plan and demonstrated an understanding of the instructions.   The patient was advised to call back or seek an in-person evaluation if the symptoms worsen or if the condition fails to improve as anticipated.  I provided 15 minutes of non-face-to-face time during this encounter.   York Spaniel, MD

## 2019-03-01 ENCOUNTER — Other Ambulatory Visit: Payer: Self-pay

## 2019-03-01 ENCOUNTER — Encounter: Payer: Self-pay | Admitting: Psychiatry

## 2019-03-01 ENCOUNTER — Ambulatory Visit: Payer: BLUE CROSS/BLUE SHIELD | Admitting: Psychiatry

## 2019-03-01 DIAGNOSIS — F422 Mixed obsessional thoughts and acts: Secondary | ICD-10-CM | POA: Diagnosis not present

## 2019-03-01 MED ORDER — SERTRALINE HCL 25 MG PO TABS: 25.0000 mg | ORAL_TABLET | Freq: Every day | ORAL | 0 refills | Status: DC

## 2019-03-01 NOTE — Progress Notes (Signed)
Luke Vazquez [id number] [birth year] 38 y.o.  Virtual Visit via Telephone Note  I connected with pt by telephone and verified that I am speaking with the correct person using two identifiers.   I discussed the limitations, risks, security and privacy concerns of performing an evaluation and management service by telephone and the availability of in person appointments. I also discussed with the patient that there may be a patient responsible charge related to this service. The patient expressed understanding and agreed to proceed.  I discussed the assessment and treatment plan with the patient. The patient was provided an opportunity to ask questions and all were answered. The patient agreed with the plan and demonstrated an understanding of the instructions.   The patient was advised to call back or seek an in-person evaluation if the symptoms worsen or if the condition fails to improve as anticipated.  I provided 15 minutes of non-face-to-face time during this encounter. The call started at 1115 and ended at 5711. The patient was located at home and the provider was located office.  Subjective:   Patient ID:  Luke Vazquez is a 38 y.o. (DOB [birth year]) male.  Chief Complaint:  Chief Complaint  Patient presents with  . Follow-up    Medication Management  . Depression    Medication Management    HPI Luke Vazquez  Luke Vazquez is assessed because of a history of obsessions around suicide without intent or plan.  This is felt to possibly be an OCD variant perhaps related to seizure meds or other neurologic reasons.  Also generalized anxiety disorder.  There was no significant evidence for depression at his last visit.  Last seen July 30, 2018.  It was recommended that he consider low-dose lithium to protect from suicidal thoughts.  Instead he preferred to start low-dose sertraline which was started at 12.5 mg daily and then was to be gradually increased to 75 mg daily.  He was  supposed to follow-up in 2 months which did not occur.Marland Kitchen.  He was also referred for counseling with [facility] Irish[facility] Vazquez for his obsession.  He called in October 22 and complained of having side effects with sertraline 1 day of 25 mg.  He felt a little off balance and restless and "a little too high".  He slept okay but it was recommended that he reduce the dose to 12.5 mg daily and follow-up with physician.  He missed his appointment in November.  "It worked" re Sertraline 12.5 mg after a couple of weeks and has had a lot less SI.  Has maintained the benefit since then.  More active in hobbies until Covid.  Will feel better when he can go back to normal activities.  Satisfied with response.  Can go 3 days without even a thought.  Enjoys things again and not depressed.  Riding Engineer, manufacturing systemsdirt bikes tomorrow.  Past Psychiatric Medication Trials: Lexapro 10 worsened anxiety over 3 days   Review of Systems:  Review of Systems  Gastrointestinal: Negative for diarrhea and nausea.  Neurological: Negative for tremors and weakness.    Medications: I have reviewed the patient's current medications.  Current Outpatient Medications  Medication Sig Dispense Refill  . lacosamide (VIMPAT) 200 MG TABS tablet Take 1 tablet (200 mg total) by mouth 2 (two) times daily. 180 each 1  . lamoTRIgine (LAMICTAL) 200 MG tablet TAKE 1 TABLET BY MOUTH 2 TIMES A DAY 180 tablet 1  . LYRICA 200 MG capsule TAKE 1 CAPSULE BY MOUTH 2 TIMES DAILY 180  capsule 1  . sertraline (ZOLOFT) 25 MG tablet Take 1 tablet (25 mg total) by mouth daily. 90 tablet 0  . valACYclovir (VALTREX) 500 MG tablet Take 1 tablet by mouth daily.     No current facility-administered medications for this visit.     Medication Side Effects: None  Allergies:  Allergies  Allergen Reactions  . Phenergan [Promethazine Hcl] Nausea And Vomiting  . Lexapro [Escitalopram Oxalate]     Increased suicidal ideation    Past Medical History:  Diagnosis Date  . Abrasion of right  side of back 03/12/2016  . Dental crowns present   . Depression   . Distal radius fracture, right 03/12/2016  . Seizure disorder (HCC) 03/15/2018  . Seizures (HCC)    last seizure > 1 yr. ago  . Shoulder separation 03/12/2016   left    History reviewed. No pertinent family history.  Social History   Socioeconomic History  . Marital status: Single    Spouse name: Not on file  . Number of children: 1  . Years of education: 12+  . Highest education level: Not on file  Occupational History  . Not on file  Social Needs  . Financial resource strain: Not on file  . Food insecurity:    Worry: Not on file    Inability: Not on file  . Transportation needs:    Medical: Not on file    Non-medical: Not on file  Tobacco Use  . Smoking status: Current Every Day Smoker    Packs/day: 1.00    Years: 20.00    Pack years: 20.00    Types: Cigarettes  . Smokeless tobacco: Never Used  Substance and Sexual Activity  . Alcohol use: No  . Drug use: No  . Sexual activity: Not on file  Lifestyle  . Physical activity:    Days per week: Not on file    Minutes per session: Not on file  . Stress: Not on file  Relationships  . Social connections:    Talks on phone: Not on file    Gets together: Not on file    Attends religious service: Not on file    Active member of club or organization: Not on file    Attends meetings of clubs or organizations: Not on file    Relationship status: Not on file  . Intimate partner violence:    Fear of current or ex partner: Not on file    Emotionally abused: Not on file    Physically abused: Not on file    Forced sexual activity: Not on file  Other Topics Concern  . Not on file  Social History Narrative      Caffeine use: Drinks daily   Right handed     Past Medical History, Surgical history, Social history, and Family history were reviewed and updated as appropriate.   SEIZURE DISORDER FOR 10 YEARS  Please see review of systems for further details  on the patient's review from today.   Objective:   Physical Exam:  There were no vitals taken for this visit.  Physical Exam Neurological:     Mental Status: He is alert and oriented to person, place, and time.     Cranial Nerves: No dysarthria.  Psychiatric:        Attention and Perception: Attention normal.        Mood and Affect: Mood normal.        Speech: Speech normal.        Behavior:  Behavior is cooperative.        Thought Content: Thought content normal. Thought content is not paranoid or delusional. Thought content does not include homicidal or suicidal ideation. Thought content does not include homicidal or suicidal plan.        Cognition and Memory: Cognition and memory normal.        Judgment: Judgment normal.     Comments: Residual mild obsessions on suicide.  tHESE ARE NOT TRUE SI      Lab Review:     Component Value Date/Time   NA 143 03/15/2018 0912   K 4.6 03/15/2018 0912   CL 105 03/15/2018 0912   CO2 24 03/15/2018 0912   GLUCOSE 76 03/15/2018 0912   GLUCOSE 97 09/12/2013 2029   BUN 10 03/15/2018 0912   CREATININE 1.04 03/15/2018 0912   CALCIUM 9.3 03/15/2018 0912   PROT 7.1 03/15/2018 0912   ALBUMIN 4.5 03/15/2018 0912   AST 15 03/15/2018 0912   ALT 16 03/15/2018 0912   ALKPHOS 109 03/15/2018 0912   BILITOT 0.4 03/15/2018 0912   GFRNONAA 91 03/15/2018 0912   GFRAA 105 03/15/2018 0912       Component Value Date/Time   WBC 10.9 (H) 03/15/2018 0912   WBC 10.9 (H) 09/12/2013 2029   RBC 5.17 03/15/2018 0912   RBC 4.82 09/12/2013 2029   HGB 16.9 03/15/2018 0912   HCT 50.4 03/15/2018 0912   PLT 305 03/15/2018 0912   MCV 98 (H) 03/15/2018 0912   MCH 32.7 03/15/2018 0912   MCH 33.8 09/12/2013 2029   MCHC 33.5 03/15/2018 0912   MCHC 35.9 09/12/2013 2029   RDW 14.2 03/15/2018 0912   LYMPHSABS 1.8 03/15/2018 0912   MONOABS 0.5 08/07/2007 1610   EOSABS 0.3 03/15/2018 0912   BASOSABS 0.0 03/15/2018 0912    No results found for: POCLITH, LITHIUM    No results found for: PHENYTOIN, PHENOBARB, VALPROATE, CBMZ   .res Assessment: Plan:    Mixed obsessional thoughts and acts   Mardella Layman has had an excellent response with obsessive suicidal thoughts from sertraline 12.5 mg a day.  He had side effects at 25 mg a day.  He has no side effects and is satisfied with the current response.  He will have an occasional remembrance that he has had intrusive suicidal thoughts in the past but then he can's quickly let that go and move on and they do not recur.  He is essentially not obsessing anymore to any significant degree on suicide.  He never appeared to have significant depression.  Did have some anxiety around the thoughts.  Never needed to go to the counseling because the sertraline worked so well.  He has maintained that benefit for several months  Cont longterm recommended bc excellent response at low dose.  Continue sertraline 25 mg tablets 1/2 tablet daily  Follow-up 6 months or sooner if there is any relapse.  Maurice March, MD, DFAPA  Please see After Visit Summary for patient specific instructions.  Future Appointments  Date Time Provider Department Center  02/26/2020  3:45 PM Glean Salvo, NP [facility] None    No orders of the defined types were placed in this encounter.     -------------------------------

## 2019-03-08 ENCOUNTER — Ambulatory Visit: Payer: Self-pay | Admitting: Neurology

## 2019-03-19 ENCOUNTER — Telehealth: Payer: Self-pay | Admitting: Neurology

## 2019-03-19 MED ORDER — LYRICA 200 MG PO CAPS: ORAL_CAPSULE | ORAL | 1 refills | Status: DC

## 2019-03-19 NOTE — Telephone Encounter (Signed)
Pt called and stated that he forgot to mention one of the reactions that he was having with the generic of LYRICA 200 MG capsule. He states that his hands would break out with it. Please advise.

## 2019-03-19 NOTE — Telephone Encounter (Signed)
On April 16 th of 2020 we did start the appeal for this medication for [name] (letter in epic).   On 02/20/19 we received a fax back stating the appeal letter had been received but in order for [employer] to proceed with the appeal pt would need to provide a written authorization giving to ok for Dr. Anne Hahn to appeal this denial on his behalf.    The number on the fax for the pt to contact I received was [phone] and the individuals name is [name] ([employer] Coordinator and Member rights/appeals).   I provided this information to the pt and he was in agreement to calling. Pt will call back if he has any further questions.

## 2019-03-19 NOTE — Telephone Encounter (Signed)
I called the patient.  He indicates that on generic Lyrica he developed a skin rash that affected mainly the hands, on brand name Lyrica, he does not have allergy.  He went to the dermatologist regarding the skin rash previously.  He currently is able to get the Lyrica but it cost him over $500 a month.  He was hoping to get the brand name Lyrica covered at a lower price because he has an allergy to the generic.

## 2019-05-07 ENCOUNTER — Telehealth: Payer: Self-pay | Admitting: Neurology
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
## 2019-05-07 NOTE — Telephone Encounter (Signed)
Pt had a seizure last night and he would like the provider to call him to discuss. Please advise.
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
## 2019-05-08 ENCOUNTER — Ambulatory Visit: Payer: BC Managed Care – PPO | Admitting: Neurology

## 2019-05-08 ENCOUNTER — Encounter: Payer: Self-pay | Admitting: Neurology

## 2019-05-08 ENCOUNTER — Other Ambulatory Visit: Payer: Self-pay

## 2019-05-08 VITALS — BP 130/91 | HR 101 | Temp 98.2°F | Ht 70.0 in | Wt 182.2 lb

## 2019-05-08 DIAGNOSIS — G40909 Epilepsy, unspecified, not intractable, without status epilepticus: Secondary | ICD-10-CM | POA: Diagnosis not present

## 2019-05-08 NOTE — Progress Notes (Signed)
I have read the note, and I agree with the clinical assessment and plan.  Charles K Willis   
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
## 2019-05-12 LAB — CBC WITH DIFFERENTIAL/PLATELET
Basophils Absolute: 0.1 10*3/uL (ref 0.0–0.2)
Basos: 1 %
EOS (ABSOLUTE): 0.1 10*3/uL (ref 0.0–0.4)
Eos: 1 %
Hematocrit: 48.9 % (ref 37.5–51.0)
Hemoglobin: 17.2 g/dL (ref 13.0–17.7)
Immature Grans (Abs): 0.1 10*3/uL (ref 0.0–0.1)
Immature Granulocytes: 1 %
Lymphocytes Absolute: 2.1 10*3/uL (ref 0.7–3.1)
Lymphs: 20 %
MCH: 32.8 pg (ref 26.6–33.0)
MCHC: 35.2 g/dL (ref 31.5–35.7)
MCV: 93 fL (ref 79–97)
Monocytes Absolute: 0.6 10*3/uL (ref 0.1–0.9)
Monocytes: 6 %
Neutrophils Absolute: 7.2 10*3/uL — ABNORMAL HIGH (ref 1.4–7.0)
Neutrophils: 71 %
Platelets: 411 10*3/uL (ref 150–450)
RBC: 5.24 x10E6/uL (ref 4.14–5.80)
RDW: 13.3 % (ref 11.6–15.4)
WBC: 10.1 10*3/uL (ref 3.4–10.8)

## 2019-05-12 LAB — LACOSAMIDE: Lacosamide: 13.9 ug/mL — ABNORMAL HIGH (ref 5.0–10.0)

## 2019-05-12 LAB — COMPREHENSIVE METABOLIC PANEL
ALT: 25 IU/L (ref 0–44)
AST: 23 IU/L (ref 0–40)
Albumin/Globulin Ratio: 1.7 (ref 1.2–2.2)
Albumin: 4.5 g/dL (ref 4.0–5.0)
Alkaline Phosphatase: 134 IU/L — ABNORMAL HIGH (ref 39–117)
BUN/Creatinine Ratio: 10 (ref 9–20)
BUN: 10 mg/dL (ref 6–20)
Bilirubin Total: 0.4 mg/dL (ref 0.0–1.2)
CO2: 23 mmol/L (ref 20–29)
Calcium: 9.9 mg/dL (ref 8.7–10.2)
Chloride: 103 mmol/L (ref 96–106)
Creatinine, Ser: 1.01 mg/dL (ref 0.76–1.27)
GFR calc Af Amer: 108 mL/min/{1.73_m2} (ref >59)
GFR calc non Af Amer: 93 mL/min/{1.73_m2} (ref >59)
Globulin, Total: 2.7 g/dL (ref 1.5–4.5)
Glucose: 95 mg/dL (ref 65–99)
Potassium: 4.9 mmol/L (ref 3.5–5.2)
Sodium: 140 mmol/L (ref 134–144)
Total Protein: 7.2 g/dL (ref 6.0–8.5)

## 2019-05-12 LAB — LAMOTRIGINE LEVEL: Lamotrigine Lvl: 8.1 ug/mL (ref 2.0–20.0)

## 2019-05-13 ENCOUNTER — Telehealth: Payer: Self-pay | Admitting: *Deleted

## 2019-05-13 ENCOUNTER — Telehealth: Payer: Self-pay | Admitting: Neurology

## 2019-05-13 DIAGNOSIS — G40909 Epilepsy, unspecified, not intractable, without status epilepticus: Secondary | ICD-10-CM
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
## 2019-05-13 NOTE — Telephone Encounter (Signed)
I am going to order a 30-day cardiac monitoring study to ensure the episodes of brief LOC are not caused by underlying cardiac rhythm problem. Please call the patient.

## 2019-05-13 NOTE — Telephone Encounter (Signed)
-----   Message from Suzzanne Cloud, NP sent at 05/13/2019  8:00 AM EDT ----- Please call the patient. Lab work looks okay. Vimpat level is random draw, slightly elevated. Will continue current dose, since no side effect. Lamictal level looks good. No change in doses. Please encourage compliance with medications.

## 2019-05-14 ENCOUNTER — Telehealth: Payer: Self-pay | Admitting: Neurology

## 2019-05-14 ENCOUNTER — Other Ambulatory Visit: Payer: Self-pay | Admitting: Neurology

## 2019-05-14 NOTE — Telephone Encounter (Signed)
Pt called in requesting a call back to discuss his seizure

## 2019-05-14 NOTE — Telephone Encounter (Signed)
I called pt.  He wanted to make sure that this was in his chart about after his seizure and was in hospital waiting to go home.  He stated that he went to sleep and when woke up he was not aware of where he was for several seconds.  Just wanted this documented and for Judson Roch to know.

## 2019-05-14 NOTE — Telephone Encounter (Signed)
Noted. This is not a new seizure. He is referring to his previous seizure earlier this month.

## 2019-05-14 NOTE — Telephone Encounter (Signed)
Yes this is correct.

## 2019-05-16 ENCOUNTER — Telehealth: Payer: Self-pay | Admitting: *Deleted

## 2019-05-16 ENCOUNTER — Other Ambulatory Visit: Payer: Self-pay

## 2019-05-16 ENCOUNTER — Ambulatory Visit: Payer: BC Managed Care – PPO | Admitting: Neurology

## 2019-05-16 DIAGNOSIS — G40909 Epilepsy, unspecified, not intractable, without status epilepticus: Secondary | ICD-10-CM | POA: Diagnosis not present

## 2019-05-16 NOTE — Telephone Encounter (Signed)
30 day [employer] cardiac event monitor to be mailed to the patients home.  Instructions reviewed briefly as they are included in the monitor kit,

## 2019-05-17 ENCOUNTER — Telehealth: Payer: Self-pay | Admitting: Neurology

## 2019-05-17 NOTE — Telephone Encounter (Signed)
I called the patient.  EEG study was normal.  He will continue on his seizure medications.

## 2019-05-17 NOTE — Procedures (Signed)
    History:  Luke Vazquez is a 38 year old gentleman with a history of seizures that have been well controlled for several years.  The patient is on 3 different seizure medications, he has been evaluated for his seizure episodes.  This is a routine EEG.  No skull defects are noted.  Medications include Lyrica, Vimpat, Lamictal, Zoloft, and Valtrex.  EEG classification: Normal awake  Description of the recording: The background rhythms of this recording consists of a fairly well modulated medium amplitude alpha rhythm of 9 Hz that is reactive to eye opening and closure. As the record progresses, the patient appears to remain in the waking state throughout the recording. Photic stimulation was performed, resulting in a bilateral and symmetric photic driving response. Hyperventilation was also performed, resulting in a minimal buildup of the background rhythm activities without significant slowing seen. At no time during the recording does there appear to be evidence of spike or spike wave discharges or evidence of focal slowing. EKG monitor shows no evidence of cardiac rhythm abnormalities with a heart rate of 78.  Impression: This is a normal EEG recording in the waking state. No evidence of ictal or interictal discharges are seen.

## 2019-05-21 ENCOUNTER — Ambulatory Visit (INDEPENDENT_AMBULATORY_CARE_PROVIDER_SITE_OTHER): Payer: BLUE CROSS/BLUE SHIELD

## 2019-05-21 DIAGNOSIS — R55 Syncope and collapse: Secondary | ICD-10-CM

## 2019-05-21 DIAGNOSIS — G40909 Epilepsy, unspecified, not intractable, without status epilepticus: Secondary | ICD-10-CM | POA: Diagnosis not present

## 2019-05-28 DIAGNOSIS — Z0289 Encounter for other administrative examinations: Secondary | ICD-10-CM

## 2019-06-04 ENCOUNTER — Telehealth: Payer: Self-pay

## 2019-06-04 NOTE — Telephone Encounter (Signed)
[employer] paper work has been completed and fwd back to medical records for processing.

## 2019-06-17 ENCOUNTER — Telehealth: Payer: Self-pay | Admitting: *Deleted

## 2019-06-17 NOTE — Telephone Encounter (Signed)
DOD Dr. Burt Knack reviewed the events and noted Sinus Tach, with no changes to be made, continue to monitor.
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
## 2019-06-26 ENCOUNTER — Other Ambulatory Visit: Payer: Self-pay | Admitting: Neurology

## 2019-06-26 DIAGNOSIS — R55 Syncope and collapse: Secondary | ICD-10-CM

## 2019-06-26 DIAGNOSIS — G40909 Epilepsy, unspecified, not intractable, without status epilepticus: Secondary | ICD-10-CM

## 2019-07-01 ENCOUNTER — Telehealth: Payer: Self-pay | Admitting: Neurology

## 2019-07-01 NOTE — Telephone Encounter (Signed)
  I called the patient, the cardiac rhythm monitor was unremarkable.   Cardiac monitor 07/01/19:  Narrative & Impression    1.  The basic rhythm is normal sinus with an average heart rate of 85 bpm 2.  There is no atrial fibrillation or flutter 3.  There are no bradycardia events or pathologic pauses greater than 3 seconds 4.  No ventricular arrhythmia

## 2019-07-02 ENCOUNTER — Encounter: Payer: Self-pay | Admitting: Neurology

## 2019-07-17 ENCOUNTER — Other Ambulatory Visit: Payer: Self-pay | Admitting: Neurology

## 2019-07-24 ENCOUNTER — Other Ambulatory Visit: Payer: Self-pay | Admitting: Neurology

## 2019-07-24 ENCOUNTER — Other Ambulatory Visit: Payer: Self-pay | Admitting: Psychiatry

## 2019-09-02 ENCOUNTER — Other Ambulatory Visit: Payer: Self-pay

## 2019-09-02 ENCOUNTER — Encounter: Payer: Self-pay | Admitting: Psychiatry

## 2019-09-02 ENCOUNTER — Ambulatory Visit (INDEPENDENT_AMBULATORY_CARE_PROVIDER_SITE_OTHER): Payer: BC Managed Care – PPO | Admitting: Psychiatry

## 2019-09-02 DIAGNOSIS — F422 Mixed obsessional thoughts and acts: Secondary | ICD-10-CM | POA: Diagnosis not present

## 2019-09-02 MED ORDER — SERTRALINE HCL 25 MG PO TABS: 25.0000 mg | ORAL_TABLET | Freq: Every day | ORAL | 0 refills | Status: DC

## 2019-09-02 NOTE — Patient Instructions (Signed)
Read Imp of the Mind by Lessie Dings on obsessions  And or  Getting Control by the same author on obsessions and compulsions

## 2019-09-02 NOTE — Progress Notes (Signed)
Luke MeuseJames L Vazquez [id number] Jun 04, 1981 38 y.o.    Subjective:   Patient ID:  Luke MeuseJames L Vazquez is a 38 y.o. (DOB Jun 04, 1981) male.  Chief Complaint:  Chief Complaint  Patient presents with  . Follow-up    Medication Management  . Other    Mixed obsessional thoughts and acts    HPI Luke Vazquez  Luke Vazquez is assessed because of a history of obsessions around suicide without intent or plan.  This is felt to possibly be an OCD variant perhaps related to seizure meds or other neurologic reasons.  Also generalized anxiety disorder.  There was no significant evidence for depression at his last visit.  Last seen July 30, 2018.  It was recommended that he consider low-dose lithium to protect from suicidal thoughts.  Instead he preferred to start low-dose sertraline which was started at 12.5 mg daily and then was to be gradually increased to 75 mg daily.  He was supposed to follow-up in 2 months which did not occur.Marland Kitchen.  He was also referred for counseling with [facility] for his obsession.  He called in October 22 and complained of having side effects with sertraline 1 day of 25 mg.  He felt a little off balance and restless and "a little too high".  He slept okay but it was recommended that he reduce the dose to 12.5 mg daily and follow-up with physician.  He missed his appointment in November.  Last seen May 2020.  Taking 12.5 mg sertraline reporting a good response.  No meds were changed.  Still works [employer].  Psych sx in control.  Still disturbed by the prior experience of SI.  No SI but once monthly worries that he did have them before.    "It worked" re Sertraline 12.5 mg after a couple of weeks and has had a lot less SI.  Has maintained the benefit since then.  More active in hobbies until Covid.  Will feel better when he can go back to normal activities.  Satisfied with response.  Can go 3 days without even a thought.  Enjoys things again and not depressed.  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  Past Psychiatric Medication Trials: Lexapro 10 worsened anxiety over 3 days, sertraline 50 mg    Review of Systems:  Review of Systems  Gastrointestinal: Negative for diarrhea and nausea.  Neurological: Negative for tremors and weakness.    Medications: I have reviewed the patient's current medications.  Current Outpatient Medications  Medication Sig Dispense Refill  . lamoTRIgine (LAMICTAL) 200 MG tablet TAKE ONE TABLET BY MOUTH TWICE A DAY 180 tablet 1  . LYRICA 200 MG capsule TAKE 1 CAPSULE BY MOUTH 2 TIMES DAILY 180 capsule 1  . sertraline (ZOLOFT) 25 MG tablet Take 1 tablet (25 mg total) by mouth daily. 90 tablet 0  . valACYclovir (VALTREX) 500 MG tablet Take 1 tablet by mouth daily.    Marland Kitchen. VIMPAT 200 MG TABS tablet TAKE 1 TABLET BY MOUTH TWICE DAILY 180 tablet 1   No current facility-administered medications for this visit.     Medication Side Effects: None  Allergies:  Allergies  Allergen Reactions  . Escitalopram Oxalate Other (See Comments)    Increased suicidal ideation Makes symptoms worse  . Phenergan [Promethazine Hcl] Nausea And Vomiting    Past Medical History:  Diagnosis Date  . Abrasion of right side of back 03/12/2016  . Dental crowns present   . Depression   . Distal radius fracture, right 03/12/2016  .  Seizure disorder (HCC) 03/15/2018  . Seizures (HCC)    last seizure > 1 yr. ago  . Shoulder separation 03/12/2016   left    History reviewed. No pertinent family history.  Social History   Socioeconomic History  . Marital status: Single    Spouse name: Not on file  . Number of children: 1  . Years of education: 12+  . Highest education level: Not on file  Occupational History  . Not on file  Social Needs  . Financial resource strain: Not on file  . Food insecurity    Worry: Not on file    Inability: Not on file  . Transportation needs    Medical: Not on file    Non-medical: Not on file  Tobacco Use  . Smoking status: Current  Every Day Smoker    Packs/day: 1.00    Years: 20.00    Pack years: 20.00    Types: Cigarettes  . Smokeless tobacco: Never Used  Substance and Sexual Activity  . Alcohol use: No  . Drug use: No  . Sexual activity: Not on file  Lifestyle  . Physical activity    Days per week: Not on file    Minutes per session: Not on file  . Stress: Not on file  Relationships  . Social Musician on phone: Not on file    Gets together: Not on file    Attends religious service: Not on file    Active member of club or organization: Not on file    Attends meetings of clubs or organizations: Not on file    Relationship status: Not on file  . Intimate partner violence    Fear of current or ex partner: Not on file    Emotionally abused: Not on file    Physically abused: Not on file    Forced sexual activity: Not on file  Other Topics Concern  . Not on file  Social History Narrative      Caffeine use: Drinks daily   Right handed     Past Medical History, Surgical history, Social history, and Family history were reviewed and updated as appropriate.   SEIZURE DISORDER FOR 10 YEARS  Please see review of systems for further details on the patient's review from today.   Objective:   Physical Exam:  There were no vitals taken for this visit.  Physical Exam Neurological:     Mental Status: He is alert and oriented to person, place, and time.     Cranial Nerves: No dysarthria.  Psychiatric:        Attention and Perception: Attention normal.        Mood and Affect: Mood normal.        Speech: Speech normal.        Behavior: Behavior is cooperative.        Thought Content: Thought content normal. Thought content is not paranoid or delusional. Thought content does not include homicidal or suicidal ideation. Thought content does not include homicidal or suicidal plan.        Cognition and Memory: Cognition and memory normal.        Judgment: Judgment normal.     Comments: Residual  mild obsessions on suicide.  tHESE ARE NOT TRUE SI      Lab Review:     Component Value Date/Time   NA 140 05/08/2019 1027   K 4.9 05/08/2019 1027   CL 103 05/08/2019 1027   CO2 23 05/08/2019  1027   GLUCOSE 95 05/08/2019 1027   GLUCOSE 97 09/12/2013 2029   BUN 10 05/08/2019 1027   CREATININE 1.01 05/08/2019 1027   CALCIUM 9.9 05/08/2019 1027   PROT 7.2 05/08/2019 1027   ALBUMIN 4.5 05/08/2019 1027   AST 23 05/08/2019 1027   ALT 25 05/08/2019 1027   ALKPHOS 134 (H) 05/08/2019 1027   BILITOT 0.4 05/08/2019 1027   GFRNONAA 93 05/08/2019 1027   GFRAA 108 05/08/2019 1027       Component Value Date/Time   WBC 10.1 05/08/2019 1027   WBC 10.9 (H) 09/12/2013 2029   RBC 5.24 05/08/2019 1027   RBC 4.82 09/12/2013 2029   HGB 17.2 05/08/2019 1027   HCT 48.9 05/08/2019 1027   PLT 411 05/08/2019 1027   MCV 93 05/08/2019 1027   MCH 32.8 05/08/2019 1027   MCH 33.8 09/12/2013 2029   MCHC 35.2 05/08/2019 1027   MCHC 35.9 09/12/2013 2029   RDW 13.3 05/08/2019 1027   LYMPHSABS 2.1 05/08/2019 1027   MONOABS 0.5 08/07/2007 1610   EOSABS 0.1 05/08/2019 1027   BASOSABS 0.1 05/08/2019 1027    No results found for: POCLITH, LITHIUM   No results found for: PHENYTOIN, PHENOBARB, VALPROATE, CBMZ   .res Assessment: Plan:    Mixed obsessional thoughts and acts   Described difference between obsessions and compulsions and rec self education.  Luke Vazquez has had an excellent response with obsessive suicidal thoughts from sertraline 12.5 mg a day.  He had side effects at 25 mg a day.  He has no side effects and is satisfied with the current response.  He will have an occasional remembrance that he has had intrusive suicidal thoughts in the past but then he can's quickly let that go and move on and they do not recur.  He is essentially not obsessing anymore to any significant degree on suicide.  He never appeared to have significant depression.  Did have some anxiety around the thoughts.  Never  needed to go to the counseling because the sertraline worked so well.  However he is still disturbed by the fact that he had this suicidal thoughts.  He has what might be considered some mild traumatic anxiety from having had those chronic suicidal thoughts and feeling out of control.  Recommended education to try to help him work through that and if he cannot work through it that way then consider counseling.  He has maintained that benefit for several months  Cont longterm recommended bc excellent response at low dose.  Needs to have sx under control for a long time to get over the trauma of having had SI in the past.  Read Imp of the Mind by Lessie Dings on obsessions And or Getting Control by the same author on obsessions and   Continue sertraline 25 mg tablets 1/2 tablet daily  Follow-up 6 months or sooner if there is any relapse.  Lynder Parents, MD, DFAPA  Please see After Visit Summary for patient specific instructions.  Future Appointments  Date Time Provider [place]  11/13/2019 10:15 AM Suzzanne Cloud, NP [facility] None  03/02/2020  9:30 AM Cottle, Billey Co., MD [facility] None    No orders of the defined types were placed in this encounter.     -------------------------------
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
## 2019-11-13 ENCOUNTER — Telehealth: Payer: Self-pay | Admitting: Neurology

## 2019-11-13 ENCOUNTER — Encounter: Payer: Self-pay | Admitting: Neurology

## 2019-11-13 ENCOUNTER — Other Ambulatory Visit: Payer: Self-pay

## 2019-11-13 ENCOUNTER — Ambulatory Visit: Payer: BC Managed Care – PPO | Admitting: Neurology

## 2019-11-13 VITALS — BP 122/94 | HR 95 | Temp 97.7°F | Ht 70.0 in | Wt 189.0 lb

## 2019-11-13 DIAGNOSIS — G40909 Epilepsy, unspecified, not intractable, without status epilepticus: Secondary | ICD-10-CM

## 2019-11-13 MED ORDER — LAMOTRIGINE 200 MG PO TABS: 200.0000 mg | ORAL_TABLET | Freq: Two times a day (BID) | ORAL | 1 refills | Status: DC

## 2019-11-13 NOTE — Telephone Encounter (Signed)
Can follow-up with patient about his appeal to get brand name Lyrica, he is allergic to generic he claims. Aundra Millet B was working with the past last year.

## 2019-11-13 NOTE — Progress Notes (Signed)
I have read the note, and I agree with the clinical assessment and plan.  [name] [name]   

## 2019-11-13 NOTE — Telephone Encounter (Signed)
I spoke to pt.  I relayed about the copay card online for him to try, but also gave him [employer] rxpathways # [id number] to see is they may be able to assist for financial assistance.  He is paying 505.48 for 60 tabs each month.  I see that appeal done 12/2018 denied.  Pt states its not his choice to take BN but medically necessary to take due to trying generic and had breakthru sz.  He will try the # for pathways and see what they tell him.

## 2019-11-13 NOTE — Telephone Encounter (Signed)
Pt called needing to speak to RN about his LYRICA 200 MG capsule and how the insurance company does not want to pay for it. Please advise.

## 2019-11-13 NOTE — Patient Instructions (Addendum)
Continue current medications, please call for recurrent seizure, I will check lab work today  Return in 6 months

## 2019-11-14 NOTE — Telephone Encounter (Signed)
I called pt and will restart the process over for his lyrica.  He states they need PA, noted he has tried the generic and had breakthru sz from reaction to it per letter from Dr. Anne Hahn.  Will try and start the process over again.

## 2019-11-15 LAB — LACOSAMIDE: Lacosamide: 12 ug/mL — ABNORMAL HIGH (ref 5.0–10.0)

## 2019-11-15 LAB — LAMOTRIGINE LEVEL: Lamotrigine Lvl: 8 ug/mL (ref 2.0–20.0)

## 2019-11-18 ENCOUNTER — Telehealth: Payer: Self-pay | Admitting: Neurology

## 2019-11-18 NOTE — Telephone Encounter (Signed)
I initiated PA for pt on CMM for BN Lyrica.  [id number].  Determination pending.
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
## 2019-11-18 NOTE — Telephone Encounter (Signed)
Received from [facility] that no PA needed on this LYRICA.  Will call see about atier reduction.

## 2019-11-19 ENCOUNTER — Telehealth: Payer: Self-pay | Admitting: *Deleted

## 2019-11-19 NOTE — Telephone Encounter (Signed)
Pt letter @ front desk for p/u

## 2019-11-20 NOTE — Telephone Encounter (Signed)
I called [place].  Spoke to [employer]. I relayed that no PA needed on BN LYRICA (which is nonformulary) no tier.  Generic pregabalin is Tier 2.  She stated new plan yr, pt deductible is $300.00, ($36.26 pd in), OOP $8550.00 ($118.18 pd in).  Once this is met, then will be copay $25.00.  A High dollar claim review form can be done on CMM, once this done would go to clinical review.

## 2019-12-17 ENCOUNTER — Encounter: Payer: Self-pay | Admitting: *Deleted

## 2019-12-18 NOTE — Telephone Encounter (Signed)
I faxed to [facility] letter for decreasing cost of lyrica (BRAND NAME).  I also completed on [facility] Initiated KEY [id number]  [facility] Request for Waiver of Brand Drug Additional Fees Form.

## 2019-12-31 ENCOUNTER — Telehealth: Payer: Self-pay | Admitting: *Deleted

## 2019-12-31 NOTE — Telephone Encounter (Signed)
I have sent mychart email to pt concerning appeal denial on getting lower cost of his brand name lyrica.  SY

## 2020-01-15 ENCOUNTER — Telehealth: Payer: Self-pay | Admitting: *Deleted

## 2020-01-15 NOTE — Telephone Encounter (Signed)
Pt called need update on dmv form.

## 2020-01-16 ENCOUNTER — Telehealth: Payer: Self-pay

## 2020-01-16 NOTE — Telephone Encounter (Signed)
Forms done by [employer]. Sarah NP signed form for [facility].

## 2020-01-16 NOTE — Telephone Encounter (Signed)
DMV form paid for and completed. PT has to come in and sign form. Given to Lenora.

## 2020-01-20 ENCOUNTER — Telehealth: Payer: Self-pay | Admitting: *Deleted

## 2020-01-20 ENCOUNTER — Other Ambulatory Visit: Payer: Self-pay | Admitting: Neurology

## 2020-01-20 NOTE — Telephone Encounter (Signed)
Pt dmv form @ the front desk for p/u.  

## 2020-02-10 ENCOUNTER — Other Ambulatory Visit: Payer: Self-pay | Admitting: Neurology

## 2020-02-26 ENCOUNTER — Ambulatory Visit: Payer: Self-pay | Admitting: Neurology

## 2020-03-02 ENCOUNTER — Other Ambulatory Visit: Payer: Self-pay

## 2020-03-02 ENCOUNTER — Ambulatory Visit (INDEPENDENT_AMBULATORY_CARE_PROVIDER_SITE_OTHER): Payer: BC Managed Care – PPO | Admitting: Psychiatry

## 2020-03-02 ENCOUNTER — Encounter: Payer: Self-pay | Admitting: Psychiatry

## 2020-03-02 DIAGNOSIS — F422 Mixed obsessional thoughts and acts: Secondary | ICD-10-CM | POA: Diagnosis not present

## 2020-03-02 MED ORDER — SERTRALINE HCL 25 MG PO TABS: 25.0000 mg | ORAL_TABLET | Freq: Every day | ORAL | 1 refills | Status: DC
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
## 2020-05-13 ENCOUNTER — Encounter: Payer: Self-pay | Admitting: Neurology

## 2020-05-13 ENCOUNTER — Ambulatory Visit: Payer: BC Managed Care – PPO | Admitting: Neurology

## 2020-05-13 VITALS — BP 133/94 | HR 89 | Ht 70.0 in | Wt 189.0 lb

## 2020-05-13 DIAGNOSIS — G40909 Epilepsy, unspecified, not intractable, without status epilepticus: Secondary | ICD-10-CM | POA: Diagnosis not present

## 2020-05-13 MED ORDER — PREGABALIN 200 MG PO CAPS: ORAL_CAPSULE | ORAL | 0 refills | Status: DC

## 2020-05-13 MED ORDER — LAMOTRIGINE 200 MG PO TABS: 200.0000 mg | ORAL_TABLET | Freq: Two times a day (BID) | ORAL | 1 refills | Status: DC
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
## 2020-05-13 NOTE — Patient Instructions (Addendum)
Continue current medications, but will try generic Lyrica, if you have problems we will go back to brand name- Call for recurrent seizures See you back in 6 months

## 2020-05-14 NOTE — Progress Notes (Signed)
I have read the note, and I agree with the clinical assessment and plan.  Jilene Spohr K Wayland Baik   

## 2020-06-02 ENCOUNTER — Telehealth: Payer: Self-pay | Admitting: Neurology

## 2020-06-02 NOTE — Telephone Encounter (Signed)
I called pt and he is stating that he is doing well with the generic lyrica, was not sure why he could only get 2 wks worth so he was having to switch back to his BRAND lyrica that he had.  He can definitely feel change in difference.  I relayed that last prescription was only for 60 tablets and had no refills.  He may just need another rx.   He will call pharmacy and thenlet [place] know if needs anything else.

## 2020-06-02 NOTE — Telephone Encounter (Signed)
Pt called wanting to speak to the RN or MD about his medications. Pt states they make him sleepy. Please advise.

## 2020-06-08 ENCOUNTER — Other Ambulatory Visit: Payer: Self-pay | Admitting: Neurology

## 2020-06-09 ENCOUNTER — Telehealth: Payer: Self-pay | Admitting: Neurology

## 2020-06-09 NOTE — Telephone Encounter (Signed)
Trent from [facility] called needing to speak to RN or provider about the pt's pregabalin (LYRICA) 200 MG capsule. Pt informed pharmacy that the pregabalin (LYRICA) 200 MG capsule is to expensive for him to get as a brand name and he is wanting to know if he can get it as a generic. Please advise.

## 2020-06-09 NOTE — Telephone Encounter (Signed)
Can you check with pharmacy on this, looks like he should have about a month left, he was trying to switch back to generic Lyrica, if he is tolerating well, he can stay on the generic.

## 2020-06-10 NOTE — Telephone Encounter (Signed)
Called and spoke with [place]. I advised him that per the registry, the patient had picked up a 30 day supply of the brand name on 7/13 and a 15 day supply of the generic on 7/14. He reviewed patient's record and noticed this too. He will void out the refill request and will speak with the patient.   Nothing further needed at time of call.

## 2020-06-18 ENCOUNTER — Other Ambulatory Visit: Payer: Self-pay | Admitting: Neurology

## 2020-07-21 ENCOUNTER — Other Ambulatory Visit: Payer: Self-pay | Admitting: Neurology

## 2020-07-29 ENCOUNTER — Telehealth: Payer: Self-pay | Admitting: Neurology

## 2020-07-29 NOTE — Telephone Encounter (Signed)
Pt called wanting to discuss with RN or provider other sleep medication options. Pt states that he can not sleep and when he does fall asleep he will wake up every hour to two hours. Please advise.

## 2020-07-29 NOTE — Telephone Encounter (Signed)
Pt states he has had difficulty sleeping for the last year, reports no other concerns, sz are stable and  No recent medication changes.  - I do not see he has ever discussed this problem before, Please advise

## 2020-07-29 NOTE — Telephone Encounter (Signed)
Spoke to pt, relayed message from NP Pt voiced understanding

## 2020-07-29 NOTE — Telephone Encounter (Signed)
I would kindly encourage him to discuss with his PCP or psychiatrist. We are prescribing him Lyrica, Vimpat, Lamictal.

## 2020-08-31 ENCOUNTER — Ambulatory Visit: Payer: BC Managed Care – PPO | Admitting: Psychiatry
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
## 2020-11-18 ENCOUNTER — Other Ambulatory Visit: Payer: Self-pay

## 2020-11-18 ENCOUNTER — Encounter: Payer: Self-pay | Admitting: Neurology

## 2020-11-18 ENCOUNTER — Ambulatory Visit: Payer: BC Managed Care – PPO | Admitting: Neurology

## 2020-11-18 VITALS — BP 139/96 | HR 87 | Ht 70.0 in | Wt 189.0 lb

## 2020-11-18 DIAGNOSIS — G40909 Epilepsy, unspecified, not intractable, without status epilepticus: Secondary | ICD-10-CM

## 2020-11-18 MED ORDER — LAMOTRIGINE 200 MG PO TABS: 200.0000 mg | ORAL_TABLET | Freq: Two times a day (BID) | ORAL | 1 refills | Status: DC

## 2020-11-18 NOTE — Patient Instructions (Signed)
Continue current medications Check Lamictal level today  Call for seizures  See you back in 6 months

## 2020-11-18 NOTE — Progress Notes (Signed)
I have read the note, and I agree with the clinical assessment and plan.  Merridy Pascoe K Jarron Curley   

## 2020-11-19 ENCOUNTER — Telehealth: Payer: Self-pay

## 2020-11-19 LAB — LAMOTRIGINE LEVEL: Lamotrigine Lvl: 7.6 ug/mL (ref 2.0–20.0)

## 2020-11-19 NOTE — Telephone Encounter (Signed)
Results given by [employer]

## 2020-12-14 ENCOUNTER — Other Ambulatory Visit: Payer: Self-pay | Admitting: Neurology

## 2020-12-29 ENCOUNTER — Telehealth: Payer: Self-pay | Admitting: Neurology

## 2020-12-29 MED ORDER — PREGABALIN 225 MG PO CAPS: 225.0000 mg | ORAL_CAPSULE | Freq: Two times a day (BID) | ORAL | 1 refills | Status: DC

## 2020-12-29 NOTE — Addendum Note (Signed)
Addended by: York Spaniel on: 12/29/2020 05:07 PM   Modules accepted: Orders

## 2020-12-29 NOTE — Telephone Encounter (Signed)
Pt. states that pregabalin (LYRICA) 200 MG capsule is not doing quite right. Please advise.

## 2020-12-29 NOTE — Telephone Encounter (Signed)
I called pt and he states that he has this same issue before when he could feel when the medication was wearing off and needed another dose.  He has not had a seizure but he states that he feels like he could.  He stated he had this before and that's when he went to [facility] but he cannot afford the brand name (PAP and copay assistance)he did not feel that he would qualify. He stated he spoke to Dr. Anne Hahn before about the when it happened.  I am not sure what you want to do.  I asked if any changes in medications, or generic itself he said no.  He takes his medications within an hour of each other (his sz meds).  He does feel it is any of the others but the pregabalin.

## 2020-12-29 NOTE — Telephone Encounter (Signed)
I called the patient.  The patient indicates that he feels that the 200 mg Lamictal tablet is wearing off in the generic form.  This is a common complaint from patients, the generic does not appear to be as strong as the brand-name drug.  I will increase the dose to the 225 mg capsule taking 1 twice daily of the generic.

## 2020-12-29 NOTE — Telephone Encounter (Signed)
I have that he is taking the medication for seizure. I thought he had rash with generic, which is why he needed brand. I am not sure what it means "not lasting as long", this is for seizure prevention was my recollection. The other option is paying the price of brand name.

## 2020-12-29 NOTE — Telephone Encounter (Signed)
Taking pregabalin, not lasting as long. Taking 200mg  po bid at this time.  NO other changes or medications.  He states this is what happened last time.  He has been feeling this for about a week now.  Will forward to SS/NP to advise.

## 2021-01-13 ENCOUNTER — Other Ambulatory Visit: Payer: Self-pay | Admitting: Neurology

## 2021-02-22 ENCOUNTER — Other Ambulatory Visit: Payer: Self-pay | Admitting: Neurology

## 2021-03-10 ENCOUNTER — Telehealth: Payer: Self-pay | Admitting: Neurology

## 2021-03-10 MED ORDER — LACOSAMIDE 50 MG PO TABS: ORAL_TABLET | ORAL | 3 refills | Status: DC

## 2021-03-10 NOTE — Telephone Encounter (Signed)
Pt called, insurance company switch me to the generic, VIMPAT 200 MG TABS tablet. The generic is not as effective. Would like a call from the nurse.

## 2021-03-10 NOTE — Telephone Encounter (Signed)
Spoke to pt and he states that he can feel that his vimpat generic is wearing off 2-3 hours prior (also he noted some headaches too).  He cannot afford $500/mo of Brand that is why changed to generic.  He is leaving OOT today for 10 days at 1800, trying to get resolved before if can.  [place] drug registry changed last fill 02-11-2021 #[id number].   Taking 200mg  po bid. [facility].

## 2021-03-10 NOTE — Telephone Encounter (Signed)
I agree with the treatment plan, at some point can check Vimpat levels to determine that they are in the therapeutic range.

## 2021-03-10 NOTE — Telephone Encounter (Signed)
We can add in a 50 mg Vimpat tablet to take 1/2 tablet with the 200 mg to equal 225 mg twice daily. Clarify what he means by wearing off.

## 2021-03-10 NOTE — Telephone Encounter (Addendum)
Spoke to pt and explained the plan. I relayed that will take extra 50mg  tablet daily.  (take 1/2 tab twice daily). In addtition to 200mg  tablet BID.  He verbalized understanding.  I relayed since a time sensitive issue to call pharmacy.  He said he would appreciated this.  Pt has had no seizures.  He just feels like the medication wears off (noting how he has felt previously on BRAND and now taking the generic).

## 2021-04-06 ENCOUNTER — Other Ambulatory Visit: Payer: Self-pay | Admitting: Psychiatry

## 2021-04-06 DIAGNOSIS — F422 Mixed obsessional thoughts and acts: Secondary | ICD-10-CM

## 2021-05-04 ENCOUNTER — Other Ambulatory Visit: Payer: Self-pay | Admitting: Neurology

## 2021-05-19 ENCOUNTER — Ambulatory Visit: Payer: BC Managed Care – PPO | Admitting: Neurology

## 2021-05-19 ENCOUNTER — Encounter: Payer: Self-pay | Admitting: Neurology
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
## 2021-06-01 ENCOUNTER — Telehealth: Payer: Self-pay | Admitting: *Deleted

## 2021-06-01 NOTE — Telephone Encounter (Addendum)
DMV form faxed to [phone]

## 2021-06-01 NOTE — Telephone Encounter (Signed)
[place] DMV forms on MD desk for review, signatures.

## 2021-06-02 DIAGNOSIS — Z0289 Encounter for other administrative examinations: Secondary | ICD-10-CM

## 2021-06-02 NOTE — Telephone Encounter (Signed)
[place] DMV forms completed, signed and sent to medical records for processing.

## 2021-06-14 ENCOUNTER — Other Ambulatory Visit: Payer: Self-pay | Admitting: Psychiatry

## 2021-06-14 DIAGNOSIS — F422 Mixed obsessional thoughts and acts: Secondary | ICD-10-CM

## 2021-06-15 NOTE — Telephone Encounter (Signed)
Please schedule appt

## 2021-06-18 NOTE — Telephone Encounter (Signed)
Left message for pt to call and schedule

## 2021-06-21 ENCOUNTER — Encounter: Payer: Self-pay | Admitting: Psychiatry

## 2021-06-21 ENCOUNTER — Ambulatory Visit: Payer: BC Managed Care – PPO | Admitting: Psychiatry

## 2021-06-21 ENCOUNTER — Other Ambulatory Visit: Payer: Self-pay

## 2021-06-21 DIAGNOSIS — F422 Mixed obsessional thoughts and acts: Secondary | ICD-10-CM

## 2021-06-21 MED ORDER — SERTRALINE HCL 25 MG PO TABS: 25.0000 mg | ORAL_TABLET | Freq: Every day | ORAL | 1 refills | Status: DC

## 2021-06-21 NOTE — Progress Notes (Signed)
Luke Vazquez [id number] 07-May-1981 40 y.o.    Subjective:   Patient ID:  Luke Vazquez is a 40 y.o. (DOB August 11, 1981) male.  Chief Complaint:  Chief Complaint  Patient presents with   Follow-up   Mixed obsessional thoughts and acts    HPI Luke Vazquez is assessed because of a history of obsessions around suicide without intent or plan.  This is felt to possibly be an OCD variant perhaps related to seizure meds or other neurologic reasons.  Also generalized anxiety disorder.  There was no significant evidence for depression at his last visit.  seen July 30, 2018.  It was recommended that he consider low-dose lithium to protect from suicidal thoughts.  Instead he preferred to start low-dose sertraline which was started at 12.5 mg daily and then was to be gradually increased to 75 mg daily.  He was supposed to follow-up in 2 months which did not occur.Marland Kitchen  He was also referred for counseling with [name] for his obsession.  He called in August 21, 2018 and complained of having side effects with sertraline 1 day of 25 mg.  He felt a little off balance and restless and "a little too high".  He slept okay but it was recommended that he reduce the dose to 12.5 mg daily and follow-up with physician.  He missed his appointment in November.  seen November 2020.  Taking 12.5 mg sertraline reporting a good response.  No meds were changed.  02/2020 app noted: Not as good as I want to be.  House being remodeled and home environment is a mess.  Still doesn't have driver's license.  Can't go anywhere except work.  Half hour away from friends.  Can't do sports he normally does.  Boredom hurts worse than anything.  Lost license 8 mos ago DT sz.  Lack of driver's license is a problem for the job now.  OCD is not worse, it's the same.  Residual adjusting of cans in cupboard facing out, clothing arranged etc. Sertraline Still works great.  Psych sx in control.  Still disturbed by the  prior experience of SI.  No SI but once monthly worries that he did have them before.   Plan: Read Imp of the Mind by Fabian Sharp on obsessions And or Getting Control by the same author on obsessions and  Continue sertraline 25 mg tablets 1/2 tablet daily  06/21/2021 appointment with the following noted: Got license back.  No SZ lately. OCD not as bad.  Not in his house until the last 6 mos and still has a lot to do.  House remodeled.  Some of OCD better when hired a maid.  They do everything for me.   Anxiety better with more normal environment and activity. Consistent with sertraline 12.5 HS and no SE with this dose. Anxiety now is better also.  Satisfied with response.  Enjoys dirt bikes.  Past Psychiatric Medication Trials: Lexapro 10 worsened anxiety over 3 days, sertraline 50 mg   Review of Systems:  Review of Systems  Cardiovascular:  Negative for palpitations.  Gastrointestinal:  Negative for diarrhea and nausea.  Neurological:  Negative for tremors, seizures and weakness.   Medications: I have reviewed the patient's current medications.  Current Outpatient Medications  Medication Sig Dispense Refill   lacosamide (VIMPAT) 50 MG TABS tablet Take 1/2 tablet twice daily (will take with Vimpat 200 mg tablet twice daily) 30 tablet 3   lamoTRIgine (LAMICTAL) 200 MG tablet TAKE  1 TABLET BY MOUTH TWICE DAILY 180 tablet 1   pregabalin (LYRICA) 225 MG capsule TAKE 1 CAPSULE BY MOUTH 2 TIMES DAILY 60 capsule 5   sertraline (ZOLOFT) 25 MG tablet TAKE 1 TABLET BY MOUTH EACH DAY (Patient taking differently: 1/2 tab) 90 tablet 0   valACYclovir (VALTREX) 500 MG tablet Take 1 tablet by mouth daily.     VIMPAT 200 MG TABS tablet TAKE 1 TABLET BY MOUTH 2 TIMES A DAY 60 tablet 5   No current facility-administered medications for this visit.    Medication Side Effects: None   Allergies:  Allergies  Allergen Reactions   Escitalopram Oxalate Other (See Comments)    Increased suicidal  ideation Makes symptoms worse   Phenergan [Promethazine Hcl] Nausea And Vomiting    Past Medical History:  Diagnosis Date   Abrasion of right side of back 03/12/2016   Dental crowns present    Depression    Distal radius fracture, right 03/12/2016   Seizure disorder (HCC) 03/15/2018   Seizures (HCC)    last seizure > 1 yr. ago   Shoulder separation 03/12/2016   left    History reviewed. No pertinent family history.  Social History   Socioeconomic History   Marital status: Single    Spouse name: Not on file   Number of children: 1   Years of education: 12+   Highest education level: Not on file  Occupational History   Not on file  Tobacco Use   Smoking status: Every Day    Packs/day: 1.00    Years: 20.00    Pack years: 20.00    Types: Cigarettes   Smokeless tobacco: Never  Vaping Use   Vaping Use: Never used  Substance and Sexual Activity   Alcohol use: No   Drug use: No   Sexual activity: Not on file  Other Topics Concern   Not on file  Social History Narrative      Caffeine use: Drinks daily   Right handed    Social Determinants of Health   Financial Resource Strain: Not on file  Food Insecurity: Not on file  Transportation Needs: Not on file  Physical Activity: Not on file  Stress: Not on file  Social Connections: Not on file  Intimate Partner Violence: Not on file    Past Medical History, Surgical history, Social history, and Family history were reviewed and updated as appropriate.   SEIZURE DISORDER FOR 10 YEARS  Please see review of systems for further details on the patient's review from today.   Objective:   Physical Exam:  There were no vitals taken for this visit.  Physical Exam Constitutional:      General: He is not in acute distress. Musculoskeletal:        General: No deformity.  Neurological:     Mental Status: He is alert and oriented to person, place, and time.     Cranial Nerves: No dysarthria.     Coordination: Coordination  normal.  Psychiatric:        Attention and Perception: Attention and perception normal. He does not perceive auditory or visual hallucinations.        Mood and Affect: Mood normal. Mood is not anxious or depressed. Affect is not labile, blunt, angry or inappropriate.        Speech: Speech normal.        Behavior: Behavior normal. Behavior is cooperative.        Thought Content: Thought content is not paranoid or  delusional. Thought content does not include homicidal or suicidal ideation. Thought content does not include homicidal or suicidal plan.        Cognition and Memory: Cognition and memory normal.        Judgment: Judgment normal.     Comments: Residual mild obsessions on suicide.  tHESE ARE NOT TRUE SI  Talkative, pleasant    Lab Review:     Component Value Date/Time   NA 140 05/08/2019 1027   K 4.9 05/08/2019 1027   CL 103 05/08/2019 1027   CO2 23 05/08/2019 1027   GLUCOSE 95 05/08/2019 1027   GLUCOSE 97 09/12/2013 2029   BUN 10 05/08/2019 1027   CREATININE 1.01 05/08/2019 1027   CALCIUM 9.9 05/08/2019 1027   PROT 7.2 05/08/2019 1027   ALBUMIN 4.5 05/08/2019 1027   AST 23 05/08/2019 1027   ALT 25 05/08/2019 1027   ALKPHOS 134 (H) 05/08/2019 1027   BILITOT 0.4 05/08/2019 1027   GFRNONAA 93 05/08/2019 1027   GFRAA 108 05/08/2019 1027       Component Value Date/Time   WBC 10.1 05/08/2019 1027   WBC 10.9 (H) 09/12/2013 2029   RBC 5.24 05/08/2019 1027   RBC 4.82 09/12/2013 2029   HGB 17.2 05/08/2019 1027   HCT 48.9 05/08/2019 1027   PLT 411 05/08/2019 1027   MCV 93 05/08/2019 1027   MCH 32.8 05/08/2019 1027   MCH 33.8 09/12/2013 2029   MCHC 35.2 05/08/2019 1027   MCHC 35.9 09/12/2013 2029   RDW 13.3 05/08/2019 1027   LYMPHSABS 2.1 05/08/2019 1027   MONOABS 0.5 08/07/2007 1610   EOSABS 0.1 05/08/2019 1027   BASOSABS 0.1 05/08/2019 1027    No results found for: POCLITH, LITHIUM   No results found for: PHENYTOIN, PHENOBARB, VALPROATE, CBMZ    .res Assessment: Plan:    Mixed obsessional thoughts and acts   Described difference between obsessions and compulsions and rec self education.  Mardella Layman has had an excellent response with obsessive suicidal thoughts from sertraline 12.5 mg a day.  He had side effects at 25 mg a day.  He has no side effects and is satisfied with the current response.  He will have an occasional remembrance that he has had intrusive suicidal thoughts in the past but then he can's quickly let that go and move on and they do not recur.  He is essentially not obsessing anymore to any significant degree on suicide.  He never appeared to have significant depression.  Did have some anxiety around the thoughts.  Never needed to go to the counseling because the sertraline worked so well.  However he is still disturbed by the fact that he had this suicidal thoughts.  He has what might be considered some mild traumatic anxiety from having had those chronic suicidal thoughts and feeling out of control.  Recommended education to try to help him work through that and if he cannot work through it that way then consider counseling.  He has maintained that benefit for several months  Cont longterm recommended bc excellent response at low dose.  Needs to have sx under control for a long time to get over the trauma of having had SI in the past.  Continue sertraline 25 mg tablets 1/2 tablet daily  Supportive therapy re:  Stress without license and can't live in house being remodeled.  Follow-up 12 mos bc more extended period  Meredith Staggers, MD, DFAPA  Please see After Visit Summary for patient specific instructions.  Future Appointments  Date Time Provider Department Center  11/22/2021  8:45 AM Glean Salvo, NP [facility] None    No orders of the defined types were placed in this encounter.     -------------------------------

## 2021-06-29 ENCOUNTER — Other Ambulatory Visit: Payer: Self-pay | Admitting: Neurology

## 2021-06-29 DIAGNOSIS — G40909 Epilepsy, unspecified, not intractable, without status epilepticus: Secondary | ICD-10-CM

## 2021-07-13 ENCOUNTER — Other Ambulatory Visit: Payer: Self-pay | Admitting: Neurology

## 2021-08-23 ENCOUNTER — Other Ambulatory Visit: Payer: Self-pay | Admitting: Neurology

## 2021-08-23 NOTE — Telephone Encounter (Signed)
Patient is due for a refill on lyrica. Patient is up to date on his appointments and has an upcoming appointment in January 2023. [place] Controlled Substance Registry checked and is appropriate.

## 2021-10-27 ENCOUNTER — Other Ambulatory Visit: Payer: Self-pay | Admitting: Neurology

## 2021-10-27 NOTE — Telephone Encounter (Signed)
Rx refilled.

## 2021-11-16 ENCOUNTER — Other Ambulatory Visit: Payer: Self-pay

## 2021-11-16 ENCOUNTER — Telehealth: Payer: Self-pay | Admitting: Neurology

## 2021-11-16 NOTE — Telephone Encounter (Signed)
Drug registry verified and Rx request sent to provider for lacosamide 200 mg tablet and pregabalin 225 mg capsule for approval. Lacosamide 50 mg tablet is not due until 12/27/2021.

## 2021-11-16 NOTE — Telephone Encounter (Signed)
I called patient to reschedule his appointment for 11/22/21 since Judson Roch will be out. He is ok with waiting until March and doing a VV and he is doing well, but thinks he will need refills on his meds before then.

## 2021-11-16 NOTE — Progress Notes (Signed)
Spoke with Abagail Kitchens at pharmacy. Patient should have one refill left of Lacosamide 200 mg tablet.  Verify Drug Registry For Lacosamide 200 mg tablet Last Filled: 11/08/2021 Quantity: 60 for 30 days Last appointment: 11/18/2020 Next appointment: 01/26/2022  Verify Drug Registry For pregabalin 225 mg capsule Last Filled:08/23/2021 Quantity: 180 for 90 days Last appointment: 11/18/2020 Next appointment: 01/26/2022

## 2021-11-22 ENCOUNTER — Ambulatory Visit: Payer: BC Managed Care – PPO | Admitting: Neurology

## 2021-12-14 ENCOUNTER — Other Ambulatory Visit: Payer: Self-pay

## 2021-12-27 ENCOUNTER — Other Ambulatory Visit: Payer: Self-pay | Admitting: *Deleted

## 2021-12-27 DIAGNOSIS — G40909 Epilepsy, unspecified, not intractable, without status epilepticus: Secondary | ICD-10-CM

## 2021-12-27 MED ORDER — LACOSAMIDE 50 MG PO TABS: ORAL_TABLET | ORAL | 1 refills | Status: DC

## 2022-01-14 ENCOUNTER — Other Ambulatory Visit: Payer: Self-pay | Admitting: Psychiatry

## 2022-01-14 ENCOUNTER — Telehealth: Payer: Self-pay | Admitting: Neurology

## 2022-01-14 MED ORDER — LACOSAMIDE 200 MG PO TABS: 200.0000 mg | ORAL_TABLET | Freq: Two times a day (BID) | ORAL | 1 refills | Status: DC

## 2022-01-14 NOTE — Telephone Encounter (Signed)
Pt called needing a refill request for his lacosamide (VIMPAT) 200 MG TABS tablet sent to the [facility] ?

## 2022-01-17 NOTE — Telephone Encounter (Signed)
It appears that Dr.Chima refilled patient's vimpat on 01/14/22 and sent to [facility]. ?

## 2022-01-26 ENCOUNTER — Telehealth: Payer: BC Managed Care – PPO | Admitting: Neurology

## 2022-01-26 ENCOUNTER — Telehealth: Payer: Self-pay | Admitting: Neurology

## 2022-01-26 NOTE — Telephone Encounter (Signed)
Pt no show due to pt forgot MyChart video visit appt. ?
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
## 2022-01-31 DIAGNOSIS — Z0289 Encounter for other administrative examinations: Secondary | ICD-10-CM

## 2022-02-15 ENCOUNTER — Other Ambulatory Visit: Payer: Self-pay

## 2022-02-15 MED ORDER — PREGABALIN 225 MG PO CAPS: 225.0000 mg | ORAL_CAPSULE | Freq: Two times a day (BID) | ORAL | 1 refills | Status: DC

## 2022-02-15 NOTE — Telephone Encounter (Signed)
Pt has requested a refill for pregabalin 225 mg. ?Per [place] drug registry last refill was 11/22/2021 # 180 for a 90 day supply. ? ?Last office visit was 11/18/2020 and next f/u is [id number]/72023.  ?

## 2022-04-25 ENCOUNTER — Other Ambulatory Visit: Payer: Self-pay | Admitting: Neurology

## 2022-06-02 ENCOUNTER — Encounter: Payer: Self-pay | Admitting: Psychiatry

## 2022-06-02 ENCOUNTER — Ambulatory Visit (INDEPENDENT_AMBULATORY_CARE_PROVIDER_SITE_OTHER): Payer: BC Managed Care – PPO | Admitting: Psychiatry

## 2022-06-02 DIAGNOSIS — F422 Mixed obsessional thoughts and acts: Secondary | ICD-10-CM | POA: Diagnosis not present

## 2022-06-02 MED ORDER — SERTRALINE HCL 25 MG PO TABS: 25.0000 mg | ORAL_TABLET | Freq: Every day | ORAL | 1 refills | Status: DC

## 2022-06-02 NOTE — Progress Notes (Signed)
Luke Vazquez [id number] Jan 13, 1981 41 y.o.    Subjective:   Patient ID:  Luke Vazquez is a 41 y.o. (DOB Jun 20, 1981) male.  Chief Complaint:  Chief Complaint  Patient presents with   Follow-up    Mixed obsessional thoughts and acts    HPI Luke Vazquez is assessed because of a history of obsessions around suicide without intent or plan.  This is felt to possibly be an OCD variant perhaps related to seizure meds or other neurologic reasons.  Also generalized anxiety disorder.  There was no significant evidence for depression at his last visit.  seen July 30, 2018.  It was recommended that he consider low-dose lithium to protect from suicidal thoughts.  Instead he preferred to start low-dose sertraline which was started at 12.5 mg daily and then was to be gradually increased to 75 mg daily.  He was supposed to follow-up in 2 months which did not occur.Marland Kitchen  He was also referred for counseling with [name] for his obsession.  He called in August 21, 2018 and complained of having side effects with sertraline 1 day of 25 mg.  He felt a little off balance and restless and "a little too high".  He slept okay but it was recommended that he reduce the dose to 12.5 mg daily and follow-up with physician.  He missed his appointment in November.  seen November 2020.  Taking 12.5 mg sertraline reporting a good response.  No meds were changed.  02/2020 app noted: Not as good as I want to be.  House being remodeled and home environment is a mess.  Still doesn't have driver's license.  Can't go anywhere except work.  Half hour away from friends.  Can't do sports he normally does.  Boredom hurts worse than anything.  Lost license 8 mos ago DT sz.  Lack of driver's license is a problem for the job now.  OCD is not worse, it's the same.  Residual adjusting of cans in cupboard facing out, clothing arranged etc. Sertraline Still works great.  Psych sx in control.  Still disturbed by the  prior experience of SI.  No SI but once monthly worries that he did have them before.   Plan: Read Imp of the Mind by Fabian Sharp on obsessions And or Getting Control by the same author on obsessions and  Continue sertraline 25 mg tablets 1/2 tablet daily  06/21/2021 appointment with the following noted: Got license back.  No SZ lately. OCD not as bad.  Not in his house until the last 6 mos and still has a lot to do.  House remodeled.  Some of OCD better when hired a maid.  They do everything for me.   Anxiety better with more normal environment and activity. Consistent with sertraline 12.5 HS and no SE with this dose. Anxiety now is better also.  Satisfied with response.  Enjoys dirt bikes. Plan: Cont longterm recommended bc excellent response at low dose.  Needs to have sx under control for a long time to get over the trauma of having had SI in the past. Continue sertraline 25 mg tablets 1/2 tablet daily  06/02/2022 appointment noted: Luke Vazquez Still doing well.  OCD and anxiety under good control.  No anxiety with sertraline. On lamotrigine 200 mg BID for SZ. And Vimpat and Lyrica.  Last SZ 2 & 1/2 year ago.  Has driver's license back. Seems like that intensifies effects of other meds. Only psych sertraline 12.5 mg  daily.  OCD is a lot better.  Fights the OCD and that has helped.  Has maids clean his house.  Delegating help has helped him be less OCD. No SE.   Other meds can interfere with sleep but not sure which one.  Sleeps better if with him.  Lives a lone.  Including better with dog.  Some trust issues and doesn't let many people in his house. Remodeled home.    Past Psychiatric Medication Trials: Lexapro 10 worsened anxiety over 3 days,  sertraline 50 mg   Review of Systems:  Review of Systems  Cardiovascular:  Negative for palpitations.  Gastrointestinal:  Negative for diarrhea and nausea.  Neurological:  Negative for tremors and seizures.       No SZ since here.    Medications:  I have reviewed the patient's current medications.  Current Outpatient Medications  Medication Sig Dispense Refill   lacosamide (VIMPAT) 200 MG TABS tablet Take 1 tablet (200 mg total) by mouth 2 (two) times daily. 180 tablet 1   lacosamide (VIMPAT) 50 MG TABS tablet TAKE 1/2 TABLET BY MOUTH TWICE DAILY (TAKE WITH 200 MG TABLET) 90 tablet 1   lamoTRIgine (LAMICTAL) 200 MG tablet TAKE 1 TABLET BY MOUTH 2 TIMES A DAY 180 tablet 1   pregabalin (LYRICA) 225 MG capsule Take 1 capsule (225 mg total) by mouth 2 (two) times daily. 180 capsule 1   valACYclovir (VALTREX) 500 MG tablet Take 1 tablet by mouth daily.     sertraline (ZOLOFT) 25 MG tablet Take 1 tablet (25 mg total) by mouth daily. TAKE 1 TABLET BY MOUTH EACH DAY 90 tablet 1   No current facility-administered medications for this visit.    Medication Side Effects: None   Allergies:  Allergies  Allergen Reactions   Escitalopram Oxalate Other (See Comments)    Increased suicidal ideation Makes symptoms worse   Phenergan [Promethazine Hcl] Nausea And Vomiting    Past Medical History:  Diagnosis Date   Abrasion of right side of back 03/12/2016   Dental crowns present    Depression    Distal radius fracture, right 03/12/2016   Seizure disorder (HCC) 03/15/2018   Seizures (HCC)    last seizure > 1 yr. ago   Shoulder separation 03/12/2016   left    History reviewed. No pertinent family history.  Social History   Socioeconomic History   Marital status: Single    Spouse name: Not on file   Number of children: 1   Years of education: 12+   Highest education level: Not on file  Occupational History   Not on file  Tobacco Use   Smoking status: Every Day    Packs/day: 1.00    Years: 20.00    Total pack years: 20.00    Types: Cigarettes   Smokeless tobacco: Never  Vaping Use   Vaping Use: Never used  Substance and Sexual Activity   Alcohol use: No   Drug use: No   Sexual activity: Not on file  Other Topics Concern   Not  on file  Social History Narrative      Caffeine use: Drinks daily   Right handed    Social Determinants of Health   Financial Resource Strain: Not on file  Food Insecurity: Not on file  Transportation Needs: Not on file  Physical Activity: Not on file  Stress: Not on file  Social Connections: Not on file  Intimate Partner Violence: Not on file    Past Medical History,  Surgical history, Social history, and Family history were reviewed and updated as appropriate.   SEIZURE DISORDER FOR 10 YEARS  Please see review of systems for further details on the patient's review from today.   Objective:   Physical Exam:  There were no vitals taken for this visit.  Physical Exam Constitutional:      General: He is not in acute distress. Musculoskeletal:        General: No deformity.  Neurological:     Mental Status: He is alert and oriented to person, place, and time.     Cranial Nerves: No dysarthria.     Coordination: Coordination normal.  Psychiatric:        Attention and Perception: Attention and perception normal. He does not perceive auditory or visual hallucinations.        Mood and Affect: Mood normal. Mood is not anxious or depressed. Affect is not labile, blunt, angry or inappropriate.        Speech: Speech normal.        Behavior: Behavior normal. Behavior is cooperative.        Thought Content: Thought content is not paranoid or delusional. Thought content does not include homicidal or suicidal ideation. Thought content does not include suicidal plan.        Cognition and Memory: Cognition and memory normal.        Judgment: Judgment normal.     Comments: Residual mild obsessions on suicide.  tHESE ARE NOT TRUE SI  Talkative, pleasant     Lab Review:     Component Value Date/Time   NA 140 05/08/2019 1027   K 4.9 05/08/2019 1027   CL 103 05/08/2019 1027   CO2 23 05/08/2019 1027   GLUCOSE 95 05/08/2019 1027   GLUCOSE 97 09/12/2013 2029   BUN 10 05/08/2019 1027    CREATININE 1.01 05/08/2019 1027   CALCIUM 9.9 05/08/2019 1027   PROT 7.2 05/08/2019 1027   ALBUMIN 4.5 05/08/2019 1027   AST 23 05/08/2019 1027   ALT 25 05/08/2019 1027   ALKPHOS 134 (H) 05/08/2019 1027   BILITOT 0.4 05/08/2019 1027   GFRNONAA 93 05/08/2019 1027   GFRAA 108 05/08/2019 1027       Component Value Date/Time   WBC 10.1 05/08/2019 1027   WBC 10.9 (H) 09/12/2013 2029   RBC 5.24 05/08/2019 1027   RBC 4.82 09/12/2013 2029   HGB 17.2 05/08/2019 1027   HCT 48.9 05/08/2019 1027   PLT 411 05/08/2019 1027   MCV 93 05/08/2019 1027   MCH 32.8 05/08/2019 1027   MCH 33.8 09/12/2013 2029   MCHC 35.2 05/08/2019 1027   MCHC 35.9 09/12/2013 2029   RDW 13.3 05/08/2019 1027   LYMPHSABS 2.1 05/08/2019 1027   MONOABS 0.5 08/07/2007 1610   EOSABS 0.1 05/08/2019 1027   BASOSABS 0.1 05/08/2019 1027    No results found for: "POCLITH", "LITHIUM"   No results found for: "PHENYTOIN", "PHENOBARB", "VALPROATE", "CBMZ"   .res Assessment: Plan:    Mixed obsessional thoughts and acts - Plan: sertraline (ZOLOFT) 25 MG tablet   Greater than 50% of 30 min face to face time with patient was spent on counseling and coordination of care. We discussed  Described difference between obsessions and compulsions and rec self education.  Mardella Layman has had an excellent response with obsessive suicidal thoughts from sertraline 12.5 mg a day.  He had side effects at 25 mg a day.  He has no side effects and is satisfied with the  current response.  He will have an occasional remembrance that he has had intrusive suicidal thoughts in the past but then he can's quickly let that go and move on and they do not recur.  He is essentially not obsessing anymore to any significant degree on suicide.  He never appeared to have significant depression.  Did have some anxiety around the thoughts.  Never needed to go to the counseling because the sertraline worked so well.  However he is still disturbed by the fact that he had  this suicidal thoughts.  He has what might be considered some mild traumatic anxiety from having had those chronic suicidal thoughts and feeling out of control.  Recommended education to try to help him work through that and if he cannot work through it that way then consider counseling.  He has maintained that benefit for several months  Cont longterm recommended bc excellent response at ultra low dose.  Needs to have sx under control for a long time to get over the trauma of having had SI in the past.  Continue sertraline 25 mg tablets 1/2 tablet daily No SE.    Supportive therapy re:  sleep and health including reducing SZ risk. Disc sleep is better with dog and will get one soon. dISC cognitive techniques for managine OCD.  Residual sx not that interfering.  Follow-up 12 mos bc more extended period  Meredith Staggers, MD, DFAPA  Please see After Visit Summary for patient specific instructions.  Future Appointments  Date Time Provider Department Center  07/07/2022  1:45 PM Glean Salvo, NP [facility] None    No orders of the defined types were placed in this encounter.     -------------------------------

## 2022-06-20 ENCOUNTER — Ambulatory Visit: Payer: BC Managed Care – PPO | Admitting: Psychiatry

## 2022-06-20 ENCOUNTER — Other Ambulatory Visit: Payer: Self-pay | Admitting: Neurology

## 2022-06-20 DIAGNOSIS — G40909 Epilepsy, unspecified, not intractable, without status epilepticus: Secondary | ICD-10-CM

## 2022-06-20 NOTE — Telephone Encounter (Signed)
Verify Drug Registry For Lacosamide 200 Mg Tablet Last Filled: 06/08/2022 Quantity: 60 tablets for 30 days Last appointment: 11/18/2020 Next appointment: 07/07/2022

## 2022-07-06 ENCOUNTER — Other Ambulatory Visit: Payer: Self-pay | Admitting: Psychiatry
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
## 2022-07-07 ENCOUNTER — Encounter: Payer: Self-pay | Admitting: Neurology

## 2022-07-07 ENCOUNTER — Ambulatory Visit: Payer: BC Managed Care – PPO | Admitting: Neurology

## 2022-07-07 DIAGNOSIS — G40909 Epilepsy, unspecified, not intractable, without status epilepticus: Secondary | ICD-10-CM

## 2022-07-07 MED ORDER — LACOSAMIDE 200 MG PO TABS: 200.0000 mg | ORAL_TABLET | Freq: Two times a day (BID) | ORAL | 1 refills | Status: DC

## 2022-07-07 MED ORDER — LACOSAMIDE 50 MG PO TABS: ORAL_TABLET | ORAL | 1 refills | Status: DC

## 2022-07-07 MED ORDER — LAMOTRIGINE 200 MG PO TABS: 200.0000 mg | ORAL_TABLET | Freq: Two times a day (BID) | ORAL | 3 refills | Status: DC

## 2022-07-07 NOTE — Patient Instructions (Signed)
We will continue current seizure medications Check labs today Please call me if you have any seizures or need anything I will see back in 6 months

## 2022-07-12 LAB — CBC WITH DIFFERENTIAL/PLATELET
Basophils Absolute: 0.1 10*3/uL (ref 0.0–0.2)
Basos: 1 %
EOS (ABSOLUTE): 0.2 10*3/uL (ref 0.0–0.4)
Eos: 2 %
Hematocrit: 49.4 % (ref 37.5–51.0)
Hemoglobin: 17.4 g/dL (ref 13.0–17.7)
Immature Grans (Abs): 0 10*3/uL (ref 0.0–0.1)
Immature Granulocytes: 0 %
Lymphocytes Absolute: 3 10*3/uL (ref 0.7–3.1)
Lymphs: 27 %
MCH: 32.8 pg (ref 26.6–33.0)
MCHC: 35.2 g/dL (ref 31.5–35.7)
MCV: 93 fL (ref 79–97)
Monocytes Absolute: 0.8 10*3/uL (ref 0.1–0.9)
Monocytes: 8 %
Neutrophils Absolute: 6.8 10*3/uL (ref 1.4–7.0)
Neutrophils: 62 %
Platelets: 371 10*3/uL (ref 150–450)
RBC: 5.31 x10E6/uL (ref 4.14–5.80)
RDW: 12.6 % (ref 11.6–15.4)
WBC: 10.9 10*3/uL — ABNORMAL HIGH (ref 3.4–10.8)

## 2022-07-12 LAB — COMPREHENSIVE METABOLIC PANEL
ALT: 10 IU/L (ref 0–44)
AST: 13 IU/L (ref 0–40)
Albumin/Globulin Ratio: 2 (ref 1.2–2.2)
Albumin: 4.7 g/dL (ref 4.1–5.1)
Alkaline Phosphatase: 136 IU/L — ABNORMAL HIGH (ref 44–121)
BUN/Creatinine Ratio: 7 — ABNORMAL LOW (ref 9–20)
BUN: 9 mg/dL (ref 6–24)
Bilirubin Total: 0.3 mg/dL (ref 0.0–1.2)
CO2: 20 mmol/L (ref 20–29)
Calcium: 10 mg/dL (ref 8.7–10.2)
Chloride: 105 mmol/L (ref 96–106)
Creatinine, Ser: 1.27 mg/dL (ref 0.76–1.27)
Globulin, Total: 2.4 g/dL (ref 1.5–4.5)
Glucose: 85 mg/dL (ref 70–99)
Potassium: 4.7 mmol/L (ref 3.5–5.2)
Sodium: 141 mmol/L (ref 134–144)
Total Protein: 7.1 g/dL (ref 6.0–8.5)
eGFR: 72 mL/min/{1.73_m2} (ref >59)

## 2022-07-12 LAB — LAMOTRIGINE LEVEL: Lamotrigine Lvl: 8.4 ug/mL (ref 2.0–20.0)

## 2022-07-12 LAB — LACOSAMIDE: Lacosamide: 15.6 ug/mL — ABNORMAL HIGH (ref 5.0–10.0)

## 2022-08-08 ENCOUNTER — Other Ambulatory Visit: Payer: Self-pay | Admitting: Neurology

## 2022-08-09 NOTE — Telephone Encounter (Signed)
Verify Drug Registry For Pregabalin 225 Mg Capsule Last Filled: 05/16/2022 Quantity: 180 capsules for 90 days Last appointment: 07/07/2022 Next appointment: 01/05/2023

## 2022-11-15 ENCOUNTER — Other Ambulatory Visit: Payer: Self-pay | Admitting: Neurology

## 2022-11-15 DIAGNOSIS — G40909 Epilepsy, unspecified, not intractable, without status epilepticus: Secondary | ICD-10-CM

## 2022-11-17 DIAGNOSIS — Z202 Contact with and (suspected) exposure to infections with a predominantly sexual mode of transmission: Secondary | ICD-10-CM | POA: Diagnosis not present

## 2022-11-17 DIAGNOSIS — Z79899 Other long term (current) drug therapy: Secondary | ICD-10-CM | POA: Diagnosis not present

## 2022-11-17 DIAGNOSIS — Z7721 Contact with and (suspected) exposure to potentially hazardous body fluids: Secondary | ICD-10-CM | POA: Diagnosis not present
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
## 2023-01-05 ENCOUNTER — Ambulatory Visit: Payer: BC Managed Care – PPO | Admitting: Neurology

## 2023-01-05 ENCOUNTER — Encounter: Payer: Self-pay | Admitting: Neurology

## 2023-01-05 VITALS — BP 132/96 | HR 87 | Ht 70.0 in | Wt 170.0 lb

## 2023-01-05 DIAGNOSIS — G40909 Epilepsy, unspecified, not intractable, without status epilepticus: Secondary | ICD-10-CM

## 2023-01-05 MED ORDER — LAMOTRIGINE 200 MG PO TABS: 200.0000 mg | ORAL_TABLET | Freq: Two times a day (BID) | ORAL | 3 refills | Status: DC

## 2023-01-05 MED ORDER — LACOSAMIDE 200 MG PO TABS: 200.0000 mg | ORAL_TABLET | Freq: Two times a day (BID) | ORAL | 1 refills | Status: DC

## 2023-01-05 NOTE — Patient Instructions (Addendum)
Continue current medications Call for seizures, see you back in 8 months

## 2023-01-11 DIAGNOSIS — Z113 Encounter for screening for infections with a predominantly sexual mode of transmission: Secondary | ICD-10-CM | POA: Diagnosis not present

## 2023-01-11 DIAGNOSIS — R030 Elevated blood-pressure reading, without diagnosis of hypertension: Secondary | ICD-10-CM | POA: Diagnosis not present

## 2023-01-11 DIAGNOSIS — R300 Dysuria: Secondary | ICD-10-CM | POA: Diagnosis not present

## 2023-02-07 ENCOUNTER — Other Ambulatory Visit: Payer: Self-pay | Admitting: Neurology

## 2023-02-07 NOTE — Telephone Encounter (Signed)
Requested Prescriptions   Pending Prescriptions Disp Refills   pregabalin (LYRICA) 225 MG capsule [Pharmacy Med Name: PREGABALIN 225 MG CAP] 180 capsule     Sig: TAKE 1 CAPSULE BY MOUTH 2 TIMES DAILY   Pt last seen 01/05/23 by slack np, has upcoming 11/7/24w/slack as well. Routing to provider to fill  Dispenses   Dispensed Days Supply Quantity Provider Pharmacy  PREGABALIN 225 MG CAP 11/07/2022 90 180 each Glean Salvo, NP [facility] ...  PREGABALIN 225 MG CAP 08/09/2022 90 180 each Glean Salvo, NP [facility] ...  PREGABALIN 225 MG CAP 05/16/2022 90 180 each Glean Salvo, NP [facility] ...  PREGABALIN 225 MG CAP 02/15/2022 90 180 each Glean Salvo, NP [facility] ...       How do dispenses affect the score?

## 2023-02-08 ENCOUNTER — Ambulatory Visit
Admission: EM | Admit: 2023-02-08 | Discharge: 2023-02-08 | Disposition: A | Discharge status: Home / Self Care | Payer: BC Managed Care – PPO | Attending: Internal Medicine | Admitting: Internal Medicine

## 2023-02-08 DIAGNOSIS — Z113 Encounter for screening for infections with a predominantly sexual mode of transmission: Secondary | ICD-10-CM | POA: Diagnosis not present

## 2023-02-08 LAB — POCT URINALYSIS DIP (MANUAL ENTRY)
Bilirubin, UA: NEGATIVE
Blood, UA: NEGATIVE
Glucose, UA: NEGATIVE mg/dL
Ketones, POC UA: NEGATIVE mg/dL
Leukocytes, UA: NEGATIVE
Nitrite, UA: NEGATIVE
Protein Ur, POC: NEGATIVE mg/dL
Spec Grav, UA: 1.015 (ref 1.010–1.025)
Urobilinogen, UA: 0.2 E.U./dL
pH, UA: 5.5 (ref 5.0–8.0)
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
## 2023-02-08 NOTE — ED Provider Notes (Signed)
[facility]    CSN: [id number] Arrival date & time: 02/08/23  1448      History   Chief Complaint Chief Complaint  Patient presents with   Dysuria    HPI Luke Vazquez is a 42 y.o. male presents for STD testing.  Patient states he had unprotected intercourse 3 weeks ago and would like STD testing.  Denies any known exposures.  Denies any current symptoms including dysuria, penile discharge, testicular pain or swelling, rashes.  Denies any other concerns at this time.   Dysuria Presenting symptoms: dysuria     Past Medical History:  Diagnosis Date   Abrasion of right side of back 03/12/2016   Dental crowns present    Depression    Distal radius fracture, right 03/12/2016   Seizure disorder 03/15/2018   Seizures    last seizure > 1 yr. 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 Authorizing Provider  lacosamide (VIMPAT) 200 MG TABS tablet Take 1 tablet (200 mg total) by mouth 2 (two) times daily. 01/05/23  Yes Glean Salvo, NP  lamoTRIgine (LAMICTAL)  200 MG tablet Take 1 tablet (200 mg total) by mouth 2 (two) times daily. 01/05/23  Yes Glean Salvo, NP  pregabalin (LYRICA) 225 MG capsule TAKE 1 CAPSULE BY MOUTH 2 TIMES DAILY 02/07/23  Yes Glean Salvo, NP  sertraline (ZOLOFT) 25 MG tablet Take 1 tablet (25 mg total) by mouth daily. TAKE 1 TABLET BY MOUTH EACH DAY 06/02/22  Yes Cottle, Steva Ready., MD  valACYclovir (VALTREX) 500 MG tablet Take 1 tablet by mouth daily. 12/12/16  Yes [provider]  lacosamide (VIMPAT) 50 MG TABS tablet TAKE 1/2 TABLET BY MOUTH TWICE DAILY (TAKE WITH 200 MG TABLET) 11/16/22   Glean Salvo, NP    Family History History reviewed. No pertinent family history.  Social History Social History   Tobacco Use   Smoking status: Every Day    Packs/day: 1.00    Years: 20.00    Additional pack years: 0.00    Total pack years: 20.00    Types: Cigarettes   Smokeless tobacco: Never  Vaping Use   Vaping Use: Never used  Substance Use Topics   Alcohol use: No   Drug use: No     Allergies   Escitalopram oxalate, Phenergan [promethazine hcl], and Promethazine   Review of Systems Review of Systems  Genitourinary:        STD screening     Physical Exam Triage Vital Signs ED Triage Vitals  Enc Vitals Group  BP 02/08/23 1500 (!) 130/90     Pulse Rate 02/08/23 1500 84     Resp 02/08/23 1500 20     Temp 02/08/23 1500 98.4 F (36.9 C)     Temp Source 02/08/23 1500 Oral     SpO2 02/08/23 1500 97 %     Weight --      Height --      Head Circumference --      Peak Flow --      Pain Score 02/08/23 1457 0     Pain Loc --      Pain Edu? --      Excl. in GC? --    No data found.  Updated Vital Signs BP (!) 130/90 (BP Location: Left Arm)   Pulse 84   Temp 98.4 F (36.9 C) (Oral)   Resp 20   SpO2 97%   Visual Acuity Right Eye Distance:   Left Eye Distance:   Bilateral Distance:    Right Eye Near:   Left Eye Near:    Bilateral Near:     Physical Exam Vitals and nursing note  reviewed.  Constitutional:      Appearance: Normal appearance.  HENT:     Head: Normocephalic and atraumatic.  Eyes:     Pupils: Pupils are equal, round, and reactive to light.  Cardiovascular:     Rate and Rhythm: Normal rate.  Pulmonary:     Effort: Pulmonary effort is normal.  Skin:    General: Skin is warm and dry.  Neurological:     General: No focal deficit present.     Mental Status: He is alert and oriented to person, place, and time.  Psychiatric:        Mood and Affect: Mood normal.        Behavior: Behavior normal.      UC Treatments / Results  Labs (all labs ordered are listed, but only abnormal results are displayed) Labs Reviewed  HIV ANTIBODY (ROUTINE TESTING W REFLEX)  RPR  POCT URINALYSIS DIP (MANUAL ENTRY)  CYTOLOGY, (ORAL, ANAL, URETHRAL) ANCILLARY ONLY    EKG   Radiology No results found.  Procedures Procedures (including critical care time)  Medications Ordered in UC Medications - No data to display  Initial Impression / Assessment and Plan / UC Course  I have reviewed the triage vital signs and the nursing notes.  Pertinent labs & imaging results that were available during my care of the patient were reviewed by me and considered in my medical decision making (see chart for details).     UA negative.  STD testing is ordered Will contact for any positive results Follow-up with PCP as needed Final Clinical Impressions(s) / UC Diagnoses   Final diagnoses:  Screening examination for STD (sexually transmitted disease)     Discharge Instructions      The clinic will contact you with results of the testing done today if positive Follow-up with your PCP as needed   ED Prescriptions   None    PDMP not reviewed this encounter.   Radford Pax, NP 02/08/23 [phone]

## 2023-02-08 NOTE — Discharge Instructions (Signed)
The clinic will contact you with results of the testing done today if positive Follow-up with your PCP as needed 

## 2023-02-09 LAB — CYTOLOGY, (ORAL, ANAL, URETHRAL) ANCILLARY ONLY
Chlamydia: NEGATIVE
Comment: NEGATIVE
Comment: NEGATIVE
Comment: NORMAL
Neisseria Gonorrhea: NEGATIVE
Trichomonas: NEGATIVE

## 2023-02-09 LAB — RPR: RPR Ser Ql: NONREACTIVE

## 2023-02-09 LAB — HIV ANTIBODY (ROUTINE TESTING W REFLEX): HIV Screen 4th Generation wRfx: NONREACTIVE

## 2023-03-20 ENCOUNTER — Other Ambulatory Visit: Payer: Self-pay | Admitting: Neurology

## 2023-03-20 DIAGNOSIS — G40909 Epilepsy, unspecified, not intractable, without status epilepticus: Secondary | ICD-10-CM

## 2023-03-20 NOTE — Telephone Encounter (Signed)
Requested Prescriptions   Pending Prescriptions Disp Refills   lacosamide (VIMPAT) 50 MG TABS tablet [Pharmacy Med Name: LACOSAMIDE 50 MG TAB] 30 tablet     Sig: TAKE 1/2 TABLET BY MOUTH TWICE DAILY (TAKE WITH 200 MG TABLET)   Last visit was 01/05/23 w/slack np, next one scheduled 09/07/23 Requesting refill to soon I will be denying this one Dispenses   Dispensed Days Supply Quantity Provider Pharmacy  LACOSAMIDE 200 MG TABS 03/07/2023 30 60 tablet Glean Salvo, NP [facility] ...  LACOSAMIDE 50 MG TABS 02/20/2023 30 30 tablet Glean Salvo, NP [facility] ...  LACOSAMIDE 200 MG TABS 02/07/2023 30 60 tablet Glean Salvo, NP [facility] ...  LACOSAMIDE 50 MG TAB 01/24/2023 30 30 each Glean Salvo, NP [facility] ...  LACOSAMIDE 200 MG TABS 01/05/2023 30 60 tablet Glean Salvo, NP [facility] ...  LACOSAMIDE 50 MG TABS 12/27/2022 30 30 tablet Glean Salvo, NP [facility] ...  LACOSAMIDE 200 MG TAB 11/01/2022 30 60 each Glean Salvo, NP [facility] ...  LACOSAMIDE 200 MG TAB 10/03/2022 30 60 each Glean Salvo, NP [facility] ...  LACOSAMIDE 50 MG TAB 09/20/2022 30 30 each Glean Salvo, NP [facility] ...  LACOSAMIDE 200 MG TAB 09/05/2022 30 60 each Glean Salvo, NP [facility] ...  LACOSAMIDE 200 MG TAB 08/08/2022 30 60 each Glean Salvo, NP [facility] ...  LACOSAMIDE 50 MG TAB 07/25/2022 30 30 each Glean Salvo, NP [facility] ...  LACOSAMIDE 200 MG TAB 07/07/2022 30 60 each Glean Salvo, NP [facility] ...  LACOSAMIDE 50 MG TAB 06/21/2022 30 30 each Glean Salvo, NP [facility] ...  LACOSAMIDE 200 MG TAB 06/08/2022 30 60 each Ocie Doyne, MD [facility] ...  LACOSAMIDE 50 MG TAB 05/23/2022 30 30 each Glean Salvo, NP [facility] ...  LACOSAMIDE 200 MG TAB 05/09/2022 30 60 each Ocie Doyne, MD  [facility] ...  LACOSAMIDE 50 MG TAB 04/25/2022 30 30 each Glean Salvo, NP [facility] ...  LACOSAMIDE 200 MG TAB 04/11/2022 30 60 each Ocie Doyne, MD [facility] ...  LACOSAMIDE 50 MG TAB 03/28/2022 30 30 each Glean Salvo, NP [facility] .Marland KitchenMarland Kitchen

## 2023-03-27 ENCOUNTER — Other Ambulatory Visit: Payer: Self-pay | Admitting: Neurology

## 2023-03-27 DIAGNOSIS — G40909 Epilepsy, unspecified, not intractable, without status epilepticus: Secondary | ICD-10-CM

## 2023-03-27 MED ORDER — LACOSAMIDE 50 MG PO TABS
ORAL_TABLET | ORAL | 3 refills | Status: DC
Start: 2023-03-27 — End: 2024-06-18

## 2023-03-29 ENCOUNTER — Other Ambulatory Visit: Payer: Self-pay | Admitting: Psychiatry

## 2023-03-29 DIAGNOSIS — F422 Mixed obsessional thoughts and acts: Secondary | ICD-10-CM

## 2023-06-05 ENCOUNTER — Encounter: Payer: Self-pay | Admitting: Psychiatry

## 2023-06-05 ENCOUNTER — Telehealth (INDEPENDENT_AMBULATORY_CARE_PROVIDER_SITE_OTHER): Payer: BC Managed Care – PPO | Admitting: Psychiatry

## 2023-06-05 DIAGNOSIS — F5105 Insomnia due to other mental disorder: Secondary | ICD-10-CM

## 2023-06-05 DIAGNOSIS — F422 Mixed obsessional thoughts and acts: Secondary | ICD-10-CM | POA: Diagnosis not present

## 2023-06-05 MED ORDER — TRAZODONE HCL 50 MG PO TABS
ORAL_TABLET | ORAL | 1 refills | Status: AC
Start: 2023-06-05 — End: ?

## 2023-06-05 NOTE — Progress Notes (Signed)
Luke Vazquez [id number] Mar 18, 1981 42 y.o.  Video Visit via My Chart  I connected with pt by video using My Chart and verified that I am speaking with the correct person using two identifiers.   I discussed the limitations, risks, security and privacy concerns of performing an evaluation and management service by My Chart  and the availability of in person appointments. I also discussed with the patient that there may be a patient responsible charge related to this service. The patient expressed understanding and agreed to proceed.  I discussed the assessment and treatment plan with the patient. The patient was provided an opportunity to ask questions and all were answered. The patient agreed with the plan and demonstrated an understanding of the instructions.   The patient was advised to call back or seek an in-person evaluation if the symptoms worsen or if the condition fails to improve as anticipated.  I provided 20 minutes of video time during this encounter.  The patient was located at home and the provider was located office. Session 1000-1020 AM  Subjective:   Patient ID:  Luke Vazquez is a 42 y.o. (DOB May 22, 1981) male.  Chief Complaint:  Chief Complaint  Patient presents with   Follow-up   Anxiety    HPI Mihajlo Hudelson is assessed because of a history of obsessions around suicide without intent or plan.  This is felt to possibly be an OCD variant perhaps related to seizure meds or other neurologic reasons.  Also generalized anxiety disorder.  There was no significant evidence for depression at his last visit.  seen July 30, 2018.  It was recommended that he consider low-dose lithium to protect from suicidal thoughts.  Instead he preferred to start low-dose sertraline which was started at 12.5 mg daily and then was to be gradually increased to 75 mg daily.  He was supposed to follow-up in 2 months which did not occur.Marland Kitchen  He was also referred for counseling  with [name] for his obsession.  He called in August 21, 2018 and complained of having side effects with sertraline 1 day of 25 mg.  He felt a little off balance and restless and "a little too high".  He slept okay but it was recommended that he reduce the dose to 12.5 mg daily and follow-up with physician.  He missed his appointment in November.  seen November 2020.  Taking 12.5 mg sertraline reporting a good response.  No meds were changed.  02/2020 app noted: Not as good as I want to be.  House being remodeled and home environment is a mess.  Still doesn't have driver's license.  Can't go anywhere except work.  Half hour away from friends.  Can't do sports he normally does.  Boredom hurts worse than anything.  Lost license 8 mos ago DT sz.  Lack of driver's license is a problem for the job now.  OCD is not worse, it's the same.  Residual adjusting of cans in cupboard facing out, clothing arranged etc. Sertraline Still works great.  Psych sx in control.  Still disturbed by the prior experience of SI.  No SI but once monthly worries that he did have them before.   Plan: Read Imp of the Mind by Fabian Sharp on obsessions And or Getting Control by the same author on obsessions and  Continue sertraline 25 mg tablets 1/2 tablet daily  06/21/2021 appointment with the following noted: Got license back.  No SZ lately. OCD not as  bad.  Not in his house until the last 6 mos and still has a lot to do.  House remodeled.  Some of OCD better when hired a maid.  They do everything for me.   Anxiety better with more normal environment and activity. Consistent with sertraline 12.5 HS and no SE with this dose. Anxiety now is better also.  Satisfied with response.  Enjoys dirt bikes. Plan: Cont longterm recommended bc excellent response at low dose.  Needs to have sx under control for a long time to get over the trauma of having had SI in the past. Continue sertraline 25 mg tablets 1/2 tablet daily  06/02/2022  appointment noted: [name] Still doing well.  OCD and anxiety under good control.  No anxiety with sertraline. On lamotrigine 200 mg BID for SZ. And Vimpat and Lyrica.  Last SZ 2 & 1/2 year ago.  Has driver's license back. Seems like that intensifies effects of other meds. Only psych sertraline 12.5 mg daily.  OCD is a lot better.  Fights the OCD and that has helped.  Has maids clean his house.  Delegating help has helped him be less OCD. No SE.   Other meds can interfere with sleep but not sure which one.  Sleeps better if with him.  Lives a lone.  Including better with dog.  Some trust issues and doesn't let many people in his house. Remodeled home.    06/05/23 appt noted: Doing well still with sertraline 12.5 mg daily as only psych med. OCD under control. Not dep.  Anxiety managed. Sleep not all that great and not sure why.  Half awake.  Some daytime drowsiness.   Terminal insomnia.    Past Psychiatric Medication Trials: Lexapro 10 worsened anxiety over 3 days,  sertraline 50 mg   Review of Systems:  Review of Systems  Cardiovascular:  Negative for palpitations.  Gastrointestinal:  Negative for diarrhea and nausea.  Neurological:  Negative for tremors and seizures.       No SZ since here.  Psychiatric/Behavioral:  Positive for sleep disturbance.     Medications: I have reviewed the patient's current medications.  Current Outpatient Medications  Medication Sig Dispense Refill   lacosamide (VIMPAT) 200 MG TABS tablet Take 1 tablet (200 mg total) by mouth 2 (two) times daily. 180 tablet 1   lacosamide (VIMPAT) 50 MG TABS tablet TAKE 1/2 TABLET BY MOUTH TWICE DAILY (TAKE WITH 200 MG TABLET) 180 tablet 3   lamoTRIgine (LAMICTAL) 200 MG tablet Take 1 tablet (200 mg total) by mouth 2 (two) times daily. 180 tablet 3   pregabalin (LYRICA) 225 MG capsule TAKE 1 CAPSULE BY MOUTH 2 TIMES DAILY 180 capsule 1   sertraline (ZOLOFT) 25 MG tablet TAKE 1 TABLET BY MOUTH EVERY DAY 90 tablet 1    traZODone (DESYREL) 50 MG tablet 1/4-1/2 tablet for sleep. 45 tablet 1   valACYclovir (VALTREX) 500 MG tablet Take 1 tablet by mouth daily.     No current facility-administered medications for this visit.    Medication Side Effects: None   Allergies:  Allergies  Allergen Reactions   Escitalopram Oxalate Other (See Comments)    Increased suicidal ideation Makes symptoms worse   Phenergan [Promethazine Hcl] Nausea And Vomiting   Promethazine Other (See Comments)    Past Medical History:  Diagnosis Date   Abrasion of right side of back 03/12/2016   Dental crowns present    Depression    Distal radius fracture, right 03/12/2016  Seizure disorder (HCC) 03/15/2018   Seizures (HCC)    last seizure > 1 yr. ago   Shoulder separation 03/12/2016   left    History reviewed. No pertinent family history.  Social History   Socioeconomic History   Marital status: Single    Spouse name: Not on file   Number of children: 1   Years of education: 12+   Highest education level: Not on file  Occupational History   Not on file  Tobacco Use   Smoking status: Every Day    Current packs/day: 1.00    Average packs/day: 1 pack/day for 20.0 years (20.0 ttl pk-yrs)    Types: Cigarettes   Smokeless tobacco: Never  Vaping Use   Vaping status: Never Used  Substance and Sexual Activity   Alcohol use: No   Drug use: No   Sexual activity: Not on file  Other Topics Concern   Not on file  Social History Narrative      Caffeine use: Drinks daily   Right handed    Social Determinants of Health   Financial Resource Strain: Not on file  Food Insecurity: Not on file  Transportation Needs: Not on file  Physical Activity: Not on file  Stress: Not on file  Social Connections: Not on file  Intimate Partner Violence: Not on file    Past Medical History, Surgical history, Social history, and Family history were reviewed and updated as appropriate.   SEIZURE DISORDER FOR 10 YEARS  Please see  review of systems for further details on the patient's review from today.   Objective:   Physical Exam:  There were no vitals taken for this visit.  Physical Exam Constitutional:      General: He is not in acute distress. Musculoskeletal:        General: No deformity.  Neurological:     Mental Status: He is alert and oriented to person, place, and time.     Cranial Nerves: No dysarthria.     Coordination: Coordination normal.  Psychiatric:        Attention and Perception: Attention and perception normal. He does not perceive auditory or visual hallucinations.        Mood and Affect: Mood normal. Mood is not anxious or depressed. Affect is not labile, blunt or angry.        Speech: Speech normal.        Behavior: Behavior normal. Behavior is cooperative.        Thought Content: Thought content is not paranoid or delusional. Thought content does not include homicidal or suicidal ideation. Thought content does not include suicidal plan.        Cognition and Memory: Cognition and memory normal.        Judgment: Judgment normal.     Comments: Residual mild obsessions on suicide.  tHESE ARE NOT TRUE SI  Talkative, pleasant     Lab Review:     Component Value Date/Time   NA 141 07/07/2022 1416   K 4.7 07/07/2022 1416   CL 105 07/07/2022 1416   CO2 20 07/07/2022 1416   GLUCOSE 85 07/07/2022 1416   GLUCOSE 97 09/12/2013 2029   BUN 9 07/07/2022 1416   CREATININE 1.27 07/07/2022 1416   CALCIUM 10.0 07/07/2022 1416   PROT 7.1 07/07/2022 1416   ALBUMIN 4.7 07/07/2022 1416   AST 13 07/07/2022 1416   ALT 10 07/07/2022 1416   ALKPHOS 136 (H) 07/07/2022 1416   BILITOT 0.3 07/07/2022 1416   GFRNONAA  93 05/08/2019 1027   GFRAA 108 05/08/2019 1027       Component Value Date/Time   WBC 10.9 (H) 07/07/2022 1416   WBC 10.9 (H) 09/12/2013 2029   RBC 5.31 07/07/2022 1416   RBC 4.82 09/12/2013 2029   HGB 17.4 07/07/2022 1416   HCT 49.4 07/07/2022 1416   PLT 371 07/07/2022 1416   MCV  93 07/07/2022 1416   MCH 32.8 07/07/2022 1416   MCH 33.8 09/12/2013 2029   MCHC 35.2 07/07/2022 1416   MCHC 35.9 09/12/2013 2029   RDW 12.6 07/07/2022 1416   LYMPHSABS 3.0 07/07/2022 1416   MONOABS 0.5 08/07/2007 1610   EOSABS 0.2 07/07/2022 1416   BASOSABS 0.1 07/07/2022 1416    No results found for: "POCLITH", "LITHIUM"   No results found for: "PHENYTOIN", "PHENOBARB", "VALPROATE", "CBMZ"   .res Assessment: Plan:    Mixed obsessional thoughts and acts  Insomnia due to mental condition - Plan: traZODone (DESYREL) 50 MG tablet   We discussed  Described difference between obsessions and compulsions and rec self education.  Mardella Layman has had an excellent response with obsessive suicidal thoughts from sertraline 12.5 mg a day.  He had side effects at 25 mg a day.  He has no side effects and is satisfied with the current response.  He will have an occasional remembrance that he has had intrusive suicidal thoughts in the past but then he can's quickly let that go and move on and they do not recur.  He is essentially not obsessing anymore to any significant degree on suicide.  He never appeared to have significant depression.  Did have some anxiety around the thoughts.  Never needed to go to the counseling because the sertraline worked so well.  However he is still disturbed by the fact that he had this suicidal thoughts.  He has what might be considered some mild traumatic anxiety from having had those chronic suicidal thoughts and feeling out of control.  Recommended education to try to help him work through that and if he cannot work through it that way then consider counseling.  He has maintained that benefit for several months  Cont longterm recommended bc excellent response at ultra low dose.  Needs to have sx under control for a long time to get over the trauma of having had SI in the past.  Continue sertraline 25 mg tablets 1/2 tablet daily No SE.    For insomnia Trazodone 50 mg tablet,  1/4-1/2 tablet for sleep. Alternative hydroxyzine   Supportive therapy re:  sleep and health including reducing SZ risk. Disc sleep is better with dog and will get one soon. dISC cognitive techniques for managine OCD.  Residual sx not that interfering.  Follow-up 12 mos bc more extended period  Meredith Staggers, MD, DFAPA  Please see After Visit Summary for patient specific instructions.  Future Appointments  Date Time Provider Department Center  09/07/2023 10:45 AM Glean Salvo, NP [facility] None    No orders of the defined types were placed in this encounter.     -------------------------------

## 2023-07-05 ENCOUNTER — Other Ambulatory Visit: Payer: Self-pay | Admitting: Neurology

## 2023-07-05 NOTE — Telephone Encounter (Signed)
Requested Prescriptions   Pending Prescriptions Disp Refills   lacosamide (VIMPAT) 200 MG TABS tablet [Pharmacy Med Name: LACOSAMIDE 200 MG TAB] 180 tablet     Sig: TAKE 1 TABLET BY MOUTH 2 TIMES A DAY   Last seen 01/05/23, next apt scheduled 09/07/23 Dispenses  Denying refill becaue they received 90 days on 06/19/23 Dispensed Days Supply Quantity Provider Pharmacy  LACOSAMIDE 50 MG TABS 06/19/2023 90 90 tablet Windell Norfolk, MD [facility] ...  LACOSAMIDE 200 MG TABS 06/06/2023 30 60 tablet Glean Salvo, NP [facility] ...  LACOSAMIDE 200 MG TABS 05/02/2023 30 60 tablet Glean Salvo, NP [facility] ...  LACOSAMIDE 200 MG TAB 04/04/2023 30 60 each Glean Salvo, NP [facility] ...  LACOSAMIDE 50 MG TAB 03/27/2023 90 90 each Windell Norfolk, MD [facility] ...  LACOSAMIDE 200 MG TABS 03/07/2023 30 60 tablet Glean Salvo, NP [facility] ...  LACOSAMIDE 50 MG TABS 02/20/2023 30 30 tablet Glean Salvo, NP [facility] ...  LACOSAMIDE 200 MG TABS 02/07/2023 30 60 tablet Glean Salvo, NP [facility] ...  LACOSAMIDE 50 MG TAB 01/24/2023 30 30 each Glean Salvo, NP [facility] ...  LACOSAMIDE 200 MG TABS 01/05/2023 30 60 tablet Glean Salvo, NP [facility] ...  LACOSAMIDE 50 MG TABS 12/27/2022 30 30 tablet Glean Salvo, NP [facility] ...  LACOSAMIDE 200 MG TAB 11/01/2022 30 60 each Glean Salvo, NP [facility] ...  LACOSAMIDE 200 MG TAB 10/03/2022 30 60 each Glean Salvo, NP [facility] ...  LACOSAMIDE 50 MG TAB 09/20/2022 30 30 each Glean Salvo, NP [facility] ...  LACOSAMIDE 200 MG TAB 09/05/2022 30 60 each Glean Salvo, NP [facility] ...  LACOSAMIDE 200 MG TAB 08/08/2022 30 60 each Glean Salvo, NP [facility] ...  LACOSAMIDE 50 MG TAB 07/25/2022 30 30 each Glean Salvo, NP [facility] .Marland KitchenMarland Kitchen

## 2023-07-07 ENCOUNTER — Other Ambulatory Visit: Payer: Self-pay | Admitting: Neurology

## 2023-07-07 MED ORDER — LACOSAMIDE 200 MG PO TABS: 200.0000 mg | ORAL_TABLET | Freq: Two times a day (BID) | ORAL | 0 refills | Status: DC

## 2023-07-07 NOTE — Telephone Encounter (Signed)
Patient requested Vimpat refill via after-hours call center message.  I sent 1 month supply with no refills to [facility].  Please review for further refills.

## 2023-07-10 MED ORDER — LAMOTRIGINE 200 MG PO TABS: 200.0000 mg | ORAL_TABLET | Freq: Two times a day (BID) | ORAL | 1 refills | Status: DC

## 2023-07-10 NOTE — Telephone Encounter (Signed)
Meds ordered this encounter  Medications   lacosamide (VIMPAT) 200 MG TABS tablet    Sig: Take 1 tablet (200 mg total) by mouth 2 (two) times daily.    Dispense:  60 tablet    Refill:  0   DISCONTD: lamoTRIgine (LAMICTAL) 200 MG tablet    Sig: Take 1 tablet (200 mg total) by mouth 2 (two) times daily.    Dispense:  180 tablet    Refill:  1    Due early October, 1 month supply filled 07/07/23. Thanks   lamoTRIgine (LAMICTAL) 200 MG tablet    Sig: Take 1 tablet (200 mg total) by mouth 2 (two) times daily.    Dispense:  180 tablet    Refill:  1    Due early October, 1 month supply filled 07/07/23. Thanks

## 2023-07-10 NOTE — Telephone Encounter (Signed)
Dispensed Days Supply Quantity Provider Pharmacy  LACOSAMIDE 50 MG TABS 06/19/2023 90 90 tablet Windell Norfolk, MD [facility] ...  LACOSAMIDE 200 MG TABS 06/06/2023 30 60 tablet Glean Salvo, NP [facility] ...  LACOSAMIDE 200 MG TABS 05/02/2023 30 60 tablet Glean Salvo, NP [facility] ...  LACOSAMIDE 200 MG TAB 04/04/2023 30 60 each Glean Salvo, NP [facility] ...  LACOSAMIDE 50 MG TAB 03/27/2023 90 90 each Windell Norfolk, MD [facility] ...  LACOSAMIDE 200 MG TABS 03/07/2023 30 60 tablet Glean Salvo, NP [facility] ...  LACOSAMIDE 50 MG TABS 02/20/2023 30 30 tablet Glean Salvo, NP [facility] ...  LACOSAMIDE 200 MG TABS 02/07/2023 30 60 tablet Glean Salvo, NP [facility] ...  LACOSAMIDE 50 MG TAB 01/24/2023 30 30 each Glean Salvo, NP [facility] ...  LACOSAMIDE 200 MG TABS 01/05/2023 30 60 tablet Glean Salvo, NP [facility] ...  LACOSAMIDE 50 MG TABS 12/27/2022 30 30 tablet Glean Salvo, NP [facility] ...  LACOSAMIDE 200 MG TAB 11/01/2022 30 60 each Glean Salvo, NP [facility] ...  LACOSAMIDE 200 MG TAB 10/03/2022 30 60 each Glean Salvo, NP [facility] ...  LACOSAMIDE 50 MG TAB 09/20/2022 30 30 each Glean Salvo, NP [facility] ...  LACOSAMIDE 200 MG TAB 09/05/2022 30 60 each Glean Salvo, NP [facility] ...  LACOSAMIDE 200 MG TAB 08/08/2022 30 60 each Glean Salvo, NP [facility] ...  LACOSAMIDE 50 MG TAB 07/25/2022 30 30 each Glean Salvo, NP [facility] ...    Last visit 01/05/23 Next visit 09/07/23 Had 30 day supply sent in by Dr. Frances Furbish over with weekend. Sending in for refills to be filled when supply is finished.

## 2023-07-10 NOTE — Telephone Encounter (Signed)
Luke Vazquez,  Since he was given the 1 month supply we need to hold off correct? Dr. Frances Furbish sent this back to [place] and I wanted to make sure.  Thanks,  [employer]

## 2023-07-10 NOTE — Addendum Note (Signed)
Addended by: Glean Salvo on: 07/10/2023 10:59 AM   Modules accepted: Orders

## 2023-07-10 NOTE — Addendum Note (Signed)
Addended by: Lenn Cal on: 07/10/2023 10:11 AM   Modules accepted: Orders

## 2023-07-31 ENCOUNTER — Other Ambulatory Visit: Payer: Self-pay | Admitting: Neurology

## 2023-08-07 ENCOUNTER — Other Ambulatory Visit: Payer: Self-pay | Admitting: Neurology

## 2023-08-07 NOTE — Telephone Encounter (Signed)
Dispensed Days Supply Quantity Provider Pharmacy  PREGABALIN 225 MG CAPS 05/08/2023 90 180 applicator Glean Salvo, NP [facility] ...  PREGABALIN 225 MG CAPS 02/07/2023 90 180 applicator Glean Salvo, NP [facility] ...  PREGABALIN 225 MG CAP 11/07/2022 90 180 each Glean Salvo, NP [facility] ...  PREGABALIN 225 MG CAP 08/09/2022 90 180 each Glean Salvo, NP [facility] ...      How do dispenses affect the score?   Last visit 01/05/2023 Next visit 09/07/2023

## 2023-08-11 ENCOUNTER — Ambulatory Visit
Admission: EM | Admit: 2023-08-11 | Discharge: 2023-08-11 | Disposition: A | Discharge status: Home / Self Care | Payer: BC Managed Care – PPO | Attending: Internal Medicine | Admitting: Internal Medicine

## 2023-08-11 DIAGNOSIS — Z113 Encounter for screening for infections with a predominantly sexual mode of transmission: Secondary | ICD-10-CM | POA: Diagnosis not present

## 2023-08-11 NOTE — ED Provider Notes (Signed)
[facility]RG[facility]  Note:  This document was prepared using Conservation officer, historic buildings and may include unintentional dictation errors.  MRN: [id number] DOB: 07-Feb-1981  Subjective:   Luke Vazquez is a 42 y.o. male presenting for standard STI screening. Denies dysuria, hematuria, urinary frequency, penile discharge, penile swelling, testicular pain, testicular swelling, anal pain, groin pain.  No known exposures.  No current facility-administered medications for this encounter.  Current Outpatient Medications:    lacosamide (VIMPAT) 200 MG TABS tablet, TAKE 1 TABLET BY MOUTH 2 TIMES A DAY, Disp: 60 tablet, Rfl: 5   lacosamide (VIMPAT) 50 MG TABS tablet, TAKE 1/2 TABLET BY MOUTH TWICE DAILY (TAKE WITH 200 MG TABLET), Disp: 180 tablet, Rfl: 3   lamoTRIgine (LAMICTAL) 200 MG tablet, Take 1 tablet (200 mg total) by mouth 2 (two) times daily., Disp: 180 tablet, Rfl: 1   pregabalin (LYRICA) 225 MG capsule, TAKE 1 CAPSULE BY MOUTH 2 TIMES DAILY, Disp: 180 capsule, Rfl: 1   sertraline (ZOLOFT) 25 MG tablet, TAKE 1 TABLET BY MOUTH EVERY DAY, Disp: 90 tablet, Rfl: 1   traZODone (DESYREL) 50 MG tablet, 1/4-1/2 tablet for sleep., Disp: 45 tablet, Rfl: 1   valACYclovir (VALTREX) 500 MG tablet, Take 1 tablet by mouth daily., Disp: , Rfl:    Allergies  Allergen Reactions   Escitalopram Oxalate Other (See Comments)    Increased suicidal ideation Makes symptoms worse   Phenergan [Promethazine Hcl] Nausea And Vomiting   Promethazine Other (See Comments)    Past Medical History:  Diagnosis Date   Abrasion of right side of back 03/12/2016   Dental crowns present    Depression    Distal radius fracture, right 03/12/2016   Seizure disorder (HCC) 03/15/2018   Seizures (HCC)    last seizure > 1 yr. ago   Shoulder separation 03/12/2016   left     Past Surgical History:  Procedure Laterality Date   OPEN REDUCTION INTERNAL FIXATION (ORIF) DISTAL RADIAL FRACTURE Right  03/17/2016   Procedure: OPEN REDUCTION INTERNAL FIXATION (ORIF) RIGHT DISTAL RADIAL FRACTURE;  Surgeon: Betha Loa, MD;  Location: [facility];  Service: Orthopedics;  Laterality: Right;   WISDOM TOOTH EXTRACTION      History reviewed. No pertinent family history.  Social History   Tobacco Use   Smoking status: Every Day    Current packs/day: 1.00    Average packs/day: 1 pack/day for 20.0 years (20.0 ttl pk-yrs)    Types: Cigarettes   Smokeless tobacco: Never  Vaping Use   Vaping status: Never Used  Substance Use Topics   Alcohol use: Not Currently   Drug use: No    ROS   Objective:   Vitals: BP 134/84 (BP Location: Right Arm)   Pulse 86   Temp 98.3 F (36.8 C) (Oral)   Resp 18   SpO2 97%   Physical Exam Constitutional:      General: He is not in acute distress.    Appearance: Normal appearance. He is well-developed and normal weight. He is not ill-appearing, toxic-appearing or diaphoretic.  HENT:     Head: Normocephalic and atraumatic.     Right Ear: External ear normal.     Left Ear: External ear normal.     Nose: Nose normal.     Mouth/Throat:     Pharynx: Oropharynx is clear.  Eyes:     General: No scleral icterus.       Right eye: No discharge.  Left eye: No discharge.     Extraocular Movements: Extraocular movements intact.  Cardiovascular:     Rate and Rhythm: Normal rate.  Pulmonary:     Effort: Pulmonary effort is normal.  Musculoskeletal:     Cervical back: Normal range of motion.  Neurological:     Mental Status: He is alert and oriented to person, place, and time.  Psychiatric:        Mood and Affect: Mood normal.        Behavior: Behavior normal.        Thought Content: Thought content normal.        Judgment: Judgment normal.     Assessment and Plan :   PDMP not reviewed this encounter.  1. Screen for STD (sexually transmitted disease)    STI check pending, will treat as appropriate based off of lab results.    Wallis Bamberg, PA-C 08/11/23 1351

## 2023-08-11 NOTE — ED Triage Notes (Signed)
Pt presents for STD's test.  

## 2023-08-12 LAB — HIV ANTIBODY (ROUTINE TESTING W REFLEX): HIV Screen 4th Generation wRfx: NONREACTIVE

## 2023-08-12 LAB — RPR: RPR Ser Ql: NONREACTIVE

## 2023-08-14 LAB — CYTOLOGY, (ORAL, ANAL, URETHRAL) ANCILLARY ONLY
Chlamydia: NEGATIVE
Comment: NEGATIVE
Comment: NEGATIVE
Comment: NORMAL
Neisseria Gonorrhea: NEGATIVE
Trichomonas: NEGATIVE

## 2023-08-23 DIAGNOSIS — Z0289 Encounter for other administrative examinations: Secondary | ICD-10-CM

## 2023-08-24 ENCOUNTER — Telehealth: Payer: Self-pay

## 2023-08-24 ENCOUNTER — Telehealth: Payer: Self-pay | Admitting: *Deleted

## 2023-08-24 NOTE — Telephone Encounter (Signed)
Pt dmv form faxed on 08/24/23

## 2023-08-24 NOTE — Telephone Encounter (Signed)
Received new DMV paperwork to complete, placed in NP office for review and signature.
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
## 2023-09-07 ENCOUNTER — Ambulatory Visit: Payer: BC Managed Care – PPO | Admitting: Neurology

## 2023-09-07 ENCOUNTER — Encounter: Payer: Self-pay | Admitting: Neurology

## 2023-09-07 VITALS — BP 122/83 | HR 94 | Ht 70.0 in | Wt 177.6 lb

## 2023-09-07 DIAGNOSIS — G40909 Epilepsy, unspecified, not intractable, without status epilepticus: Secondary | ICD-10-CM | POA: Diagnosis not present

## 2023-09-07 MED ORDER — LAMOTRIGINE 200 MG PO TABS: 200.0000 mg | ORAL_TABLET | Freq: Two times a day (BID) | ORAL | 3 refills | Status: DC

## 2023-09-07 NOTE — Patient Instructions (Signed)
Continue current medications.  We will check labs today.  Please call for seizure activity.  I will have you see Dr. Teresa Coombs in 1 year.  Thanks

## 2023-09-14 LAB — COMPREHENSIVE METABOLIC PANEL
ALT: 18 IU/L (ref 0–44)
AST: 23 IU/L (ref 0–40)
Albumin: 4.7 g/dL (ref 4.1–5.1)
Alkaline Phosphatase: 130 [IU]/L — ABNORMAL HIGH (ref 44–121)
BUN/Creatinine Ratio: 7 — ABNORMAL LOW (ref 9–20)
BUN: 7 mg/dL (ref 6–24)
Bilirubin Total: 0.4 mg/dL (ref 0.0–1.2)
CO2: 23 mmol/L (ref 20–29)
Calcium: 9.7 mg/dL (ref 8.7–10.2)
Chloride: 105 mmol/L (ref 96–106)
Creatinine, Ser: 0.97 mg/dL (ref 0.76–1.27)
Globulin, Total: 2.4 g/dL (ref 1.5–4.5)
Glucose: 107 mg/dL — ABNORMAL HIGH (ref 70–99)
Potassium: 4.9 mmol/L (ref 3.5–5.2)
Sodium: 141 mmol/L (ref 134–144)
Total Protein: 7.1 g/dL (ref 6.0–8.5)
eGFR: 99 mL/min/{1.73_m2} (ref >59)

## 2023-09-14 LAB — CBC WITH DIFFERENTIAL/PLATELET
Basophils Absolute: 0.1 10*3/uL (ref 0.0–0.2)
Basos: 1 %
EOS (ABSOLUTE): 0.2 10*3/uL (ref 0.0–0.4)
Eos: 2 %
Hematocrit: 51.2 % — ABNORMAL HIGH (ref 37.5–51.0)
Hemoglobin: 17.4 g/dL (ref 13.0–17.7)
Immature Grans (Abs): 0 10*3/uL (ref 0.0–0.1)
Immature Granulocytes: 0 %
Lymphocytes Absolute: 2.6 10*3/uL (ref 0.7–3.1)
Lymphs: 33 %
MCH: 32.3 pg (ref 26.6–33.0)
MCHC: 34 g/dL (ref 31.5–35.7)
MCV: 95 fL (ref 79–97)
Monocytes Absolute: 0.6 10*3/uL (ref 0.1–0.9)
Monocytes: 8 %
Neutrophils Absolute: 4.4 10*3/uL (ref 1.4–7.0)
Neutrophils: 56 %
Platelets: 356 10*3/uL (ref 150–450)
RBC: 5.38 x10E6/uL (ref 4.14–5.80)
RDW: 12.9 % (ref 11.6–15.4)
WBC: 8 10*3/uL (ref 3.4–10.8)

## 2023-09-14 LAB — LACOSAMIDE: Lacosamide: 15.4 ug/mL — ABNORMAL HIGH (ref 5.0–10.0)

## 2023-09-14 LAB — LAMOTRIGINE LEVEL: Lamotrigine Lvl: 7.9 ug/mL (ref 2.0–20.0)

## 2023-12-28 ENCOUNTER — Encounter: Payer: Self-pay | Admitting: Internal Medicine

## 2023-12-28 ENCOUNTER — Ambulatory Visit (INDEPENDENT_AMBULATORY_CARE_PROVIDER_SITE_OTHER): Payer: BC Managed Care – PPO | Admitting: Internal Medicine

## 2023-12-28 ENCOUNTER — Other Ambulatory Visit (HOSPITAL_COMMUNITY)
Admission: RE | Admit: 2023-12-28 | Discharge: 2023-12-28 | Disposition: A | Discharge status: Home / Self Care | Payer: BC Managed Care – PPO | Source: Ambulatory Visit | Attending: Internal Medicine | Admitting: Internal Medicine

## 2023-12-28 VITALS — BP 134/82 | HR 82 | Temp 98.5°F | Ht 70.0 in | Wt 180.2 lb

## 2023-12-28 DIAGNOSIS — Z113 Encounter for screening for infections with a predominantly sexual mode of transmission: Secondary | ICD-10-CM | POA: Insufficient documentation

## 2023-12-28 DIAGNOSIS — F4322 Adjustment disorder with anxiety: Secondary | ICD-10-CM

## 2023-12-28 DIAGNOSIS — G40909 Epilepsy, unspecified, not intractable, without status epilepticus: Secondary | ICD-10-CM

## 2023-12-28 DIAGNOSIS — B009 Herpesviral infection, unspecified: Secondary | ICD-10-CM

## 2023-12-28 MED ORDER — VALACYCLOVIR HCL 500 MG PO TABS
500.0000 mg | ORAL_TABLET | Freq: Every day | ORAL | 3 refills | Status: DC
Start: 2023-12-28 — End: 2024-02-19

## 2023-12-28 NOTE — Progress Notes (Signed)
 [facility]IMARY CARE [facility] [address] [place] [address] Dept: [phone] Dept Fax: [phone]  New Patient Office Visit  Subjective:   Luke Vazquez 1981/03/15 12/28/2023  Chief Complaint  Patient presents with   Establish Care    STD and HIV testing     HPI: Luke Vazquez presents today to establish care at [facility]. Introduced to Publishing rights manager role and practice setting.  All questions answered.  Concerns: See below   Discussed the use of AI scribe software for clinical note transcription with the patient, who gave verbal consent to proceed.  History of Present Illness   The patient, with a history of seizures and anxiety, presents for establishing care. He is currently managed by a neurologist and a behavioral health specialist. His seizure disorder is controlled with Lamictal, Vimpat, and Lyrica, with the last reported seizure occurring over five years ago. He also takes Zoloft for anxiety and Trazodone to aid sleep.   The patient also has a history of genital herpes, managed with daily Valtrex. He reports no new symptoms related to this condition but requests a refill of his medication and an STD screening. He does report recent unprotected sex with new male partner. Denies burning with urination, penile discharge.       The following portions of the patient's history were reviewed and updated as appropriate: past medical history, past surgical history, family history, social history, allergies, medications, and problem list.   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 ago   Shoulder separation 03/12/2016   left   Past Surgical History:  Procedure Laterality Date   OPEN REDUCTION INTERNAL FIXATION (ORIF) DISTAL RADIAL FRACTURE Right 03/17/2016   Procedure: OPEN REDUCTION INTERNAL FIXATION (ORIF) RIGHT DISTAL RADIAL FRACTURE;  Surgeon: Betha Loa, MD;  Location: [facility];  Service: Orthopedics;  Laterality: Right;   WISDOM TOOTH EXTRACTION     History reviewed. No pertinent family history.  Current Outpatient Medications:    lacosamide (VIMPAT) 200 MG TABS tablet, TAKE 1 TABLET BY MOUTH 2 TIMES A DAY, Disp: 60 tablet, Rfl: 5   lamoTRIgine (LAMICTAL) 200 MG tablet, Take 1 tablet (200 mg total) by mouth 2 (two) times daily., Disp: 180 tablet, Rfl: 3   pregabalin (LYRICA) 225 MG capsule, TAKE 1 CAPSULE BY MOUTH 2 TIMES DAILY, Disp: 180 capsule, Rfl: 1   sertraline (ZOLOFT) 25 MG tablet, TAKE 1 TABLET BY MOUTH EVERY DAY, Disp: 90 tablet, Rfl: 1   traZODone (DESYREL) 50 MG tablet, 1/4-1/2 tablet for sleep., Disp: 45 tablet, Rfl: 1   lacosamide (VIMPAT) 50 MG TABS tablet, TAKE 1/2 TABLET BY MOUTH TWICE DAILY (TAKE WITH 200 MG TABLET), Disp: 180 tablet, Rfl: 3   valACYclovir (VALTREX) 500 MG tablet, Take 1 tablet (  500 mg total) by mouth daily., Disp: 90 tablet, Rfl: 3 Allergies  Allergen Reactions   Escitalopram Oxalate Other (See Comments)    Increased suicidal ideation  Makes symptoms worse  Makes Symptoms Worse   Phenergan [Promethazine Hcl] Nausea And Vomiting   Promethazine Other (See Comments)    ROS: A complete ROS was performed with pertinent positives/negatives noted in the  HPI. The remainder of the ROS are negative.   Objective:   Today's Vitals   12/28/23 0844  BP: 134/82  Pulse: 82  Temp: 98.5 F (36.9 C)  TempSrc: Temporal  SpO2: 98%  Weight: 180 lb 3.2 oz (81.7 kg)  Height: 5\' 10"  (1.778 m)    GENERAL: Well-appearing, in NAD. Well nourished.  SKIN: Pink, warm and dry. No rash, lesion, ulceration, or ecchymoses.  NECK: Trachea midline. Full ROM w/o pain or tenderness. No lymphadenopathy.  RESPIRATORY: Chest wall symmetrical. Respirations even and non-labored. Breath sounds clear to auscultation bilaterally.  CARDIAC: S1, S2 present, regular rate and rhythm. Peripheral pulses 2+ bilaterally.  EXTREMITIES: Without clubbing, cyanosis, or edema.  NEUROLOGIC: No motor or sensory deficits. Steady, even gait.  PSYCH/MENTAL STATUS: Alert, oriented x 3. Cooperative, appropriate mood and affect.   Health Maintenance Due  Topic Date Due   Hepatitis C Screening  Never done    No results found for any visits on 12/28/23.  Assessment & Plan:  Assessment and Plan    Seizure Disorder Stable on Lamictal, Vimpat, and Lyrica. Last seizure reported approximately 5 years ago. -Continue routine follow up with neurology   Anxiety Managed with Zoloft and Trazodone (for sleep). -Continue current medications and follow up with [facility].   Genital Herpes Daily suppressive therapy with Valtrex 500mg . -Renew Valtrex prescription.  STD Screening Unprotected sex with a new partner. No symptoms of urinary tract infection or sexually transmitted infection. -Collect urine sample (GC/CH/Trich), and collected blood work (HIV, Hep B, Hep C, RPR)   General Health Maintenance -Schedule annual physical in approximately 7 months (after 43th birthday in September). -At physical, discuss colon cancer screening and perform fasting blood work.        Orders Placed This Encounter  Procedures   HepB+HepC+HIV Panel   RPR   Meds ordered this encounter  Medications    valACYclovir (VALTREX) 500 MG tablet    Sig: Take 1 tablet (500 mg total) by mouth daily.    Dispense:  90 tablet    Refill:  3    Supervising Provider:   Garnette Gunner [[id number]]    Return in about 7 months (around 07/27/2024) for Fasting Annual Physical Exam.   Salvatore Decent, FNP

## 2023-12-29 ENCOUNTER — Other Ambulatory Visit: Payer: Self-pay | Admitting: Psychiatry

## 2023-12-29 DIAGNOSIS — F422 Mixed obsessional thoughts and acts: Secondary | ICD-10-CM

## 2023-12-29 LAB — URINE CYTOLOGY ANCILLARY ONLY
Chlamydia: NEGATIVE
Comment: NEGATIVE
Comment: NEGATIVE
Comment: NORMAL
Neisseria Gonorrhea: NEGATIVE
Trichomonas: NEGATIVE

## 2023-12-29 LAB — RPR: RPR Ser Ql: NONREACTIVE

## 2023-12-30 LAB — HEPB+HEPC+HIV PANEL
HIV Screen 4th Generation wRfx: NONREACTIVE
Hep B C IgM: NEGATIVE
Hep B Core Total Ab: NEGATIVE
Hep B E Ab: NONREACTIVE
Hep B E Ag: NEGATIVE
Hep B Surface Ab, Qual: NONREACTIVE
Hep C Virus Ab: NONREACTIVE
Hepatitis B Surface Ag: NEGATIVE

## 2024-01-03 ENCOUNTER — Encounter: Payer: Self-pay | Admitting: Internal Medicine

## 2024-01-03 ENCOUNTER — Other Ambulatory Visit: Payer: Self-pay | Admitting: Internal Medicine

## 2024-01-03 DIAGNOSIS — Z789 Other specified health status: Secondary | ICD-10-CM

## 2024-01-29 ENCOUNTER — Other Ambulatory Visit: Payer: Self-pay | Admitting: Neurology

## 2024-01-29 NOTE — Telephone Encounter (Signed)
 Requested Prescriptions   Pending Prescriptions Disp Refills   lacosamide (VIMPAT) 200 MG TABS tablet [Pharmacy Med Name: LACOSAMIDE 200 MG TAB] 60 tablet     Sig: TAKE 1 TABLET BY MOUTH 2 TIMES A DAY   Last seen 09/07/23, next appt 09/17/24  Dispenses   Dispensed Days Supply Quantity Provider Pharmacy  LACOSAMIDE 200 MG TABS 01/02/2024 30 60 tablet Glean Salvo, NP [facility] ...  LACOSAMIDE 50 MG TABS 12/19/2023 90 90 tablet Windell Norfolk, MD [facility] ...  LACOSAMIDE 200 MG TABS 12/05/2023 30 60 tablet Glean Salvo, NP [facility] ...  LACOSAMIDE 200 MG TABS 11/06/2023 30 60 tablet Glean Salvo, NP [facility] ...  LACOSAMIDE 200 MG TABS 10/03/2023 30 60 tablet Glean Salvo, NP [facility] ...  LACOSAMIDE 50 MG TABS 09/18/2023 90 90 tablet Windell Norfolk, MD [facility] ...  LACOSAMIDE 200 MG TABS 09/04/2023 30 60 tablet Glean Salvo, NP [facility] ...  LACOSAMIDE 200 MG TABS 08/04/2023 30 60 tablet Glean Salvo, NP [facility] ...  LACOSAMIDE 200 MG TABS 07/07/2023 30 60 tablet Huston Foley, MD [facility] ...  LACOSAMIDE 50 MG TABS 06/19/2023 90 90 tablet Windell Norfolk, MD [facility] ...  LACOSAMIDE 200 MG TABS 06/06/2023 30 60 tablet Glean Salvo, NP [facility] ...  LACOSAMIDE 200 MG TABS 05/02/2023 30 60 tablet Glean Salvo, NP [facility] ...  LACOSAMIDE 200 MG TAB 04/04/2023 30 60 each Glean Salvo, NP [facility] ...  LACOSAMIDE 50 MG TAB 03/27/2023 90 90 each Windell Norfolk, MD [facility] ...  LACOSAMIDE 200 MG TABS 03/07/2023 30 60 tablet Glean Salvo, NP [facility] ...  LACOSAMIDE 50 MG TABS 02/20/2023 30 30 tablet Glean Salvo, NP [facility] ...  LACOSAMIDE 200 MG TABS 02/07/2023 30 60 tablet Glean Salvo, NP [facility] .Marland KitchenMarland Kitchen

## 2024-02-05 ENCOUNTER — Other Ambulatory Visit: Payer: Self-pay | Admitting: Neurology

## 2024-02-06 NOTE — Telephone Encounter (Signed)
 Last seen 09/07/23, next appt 09/17/24 Dispenses   Dispensed Days Supply Quantity Provider Pharmacy  PREGABALIN 225 MG CAPS 11/06/2023 90 180 applicator Glean Salvo, NP [facility] ...  PREGABALIN 225 MG CAPS 08/07/2023 90 180 applicator Glean Salvo, NP [facility] ...  PREGABALIN 225 MG CAPS 05/08/2023 90 180 applicator Glean Salvo, NP [facility] .Marland KitchenMarland Kitchen

## 2024-02-19 ENCOUNTER — Telehealth: Payer: Self-pay | Admitting: Internal Medicine

## 2024-02-19 ENCOUNTER — Other Ambulatory Visit: Payer: Self-pay | Admitting: Internal Medicine

## 2024-02-19 DIAGNOSIS — B009 Herpesviral infection, unspecified: Secondary | ICD-10-CM

## 2024-02-19 MED ORDER — VALACYCLOVIR HCL 500 MG PO TABS
500.0000 mg | ORAL_TABLET | Freq: Every day | ORAL | 3 refills | Status: AC
Start: 2024-02-19 — End: ?

## 2024-02-19 NOTE — Telephone Encounter (Signed)
Rx sent in now. 

## 2024-02-19 NOTE — Telephone Encounter (Signed)
 valACYclovir  (VALTREX ) 500 MG tablet [[id number]]  DISCONTINUED Pt is requesting for this to be resent. His insurance will now cover.  [phone] [facility]  Pharmacy: [facility] - [place], [place] - [address]

## 2024-04-20 DIAGNOSIS — Y929 Unspecified place or not applicable: Secondary | ICD-10-CM | POA: Diagnosis not present

## 2024-04-20 DIAGNOSIS — S91115A Laceration without foreign body of left lesser toe(s) without damage to nail, initial encounter: Secondary | ICD-10-CM | POA: Diagnosis not present

## 2024-04-20 DIAGNOSIS — W268XXA Contact with other sharp object(s), not elsewhere classified, initial encounter: Secondary | ICD-10-CM | POA: Diagnosis not present

## 2024-04-20 DIAGNOSIS — Y999 Unspecified external cause status: Secondary | ICD-10-CM | POA: Diagnosis not present

## 2024-04-20 DIAGNOSIS — Y939 Activity, unspecified: Secondary | ICD-10-CM | POA: Diagnosis not present

## 2024-04-20 DIAGNOSIS — F17200 Nicotine dependence, unspecified, uncomplicated: Secondary | ICD-10-CM | POA: Diagnosis not present

## 2024-06-18 ENCOUNTER — Other Ambulatory Visit: Payer: Self-pay | Admitting: Neurology

## 2024-06-18 DIAGNOSIS — G40909 Epilepsy, unspecified, not intractable, without status epilepticus: Secondary | ICD-10-CM

## 2024-06-18 NOTE — Telephone Encounter (Signed)
 Requested Prescriptions   Pending Prescriptions Disp Refills   lacosamide  (VIMPAT ) 50 MG TABS tablet [Pharmacy Med Name: LACOSAMIDE  50 MG TAB] 180 tablet     Sig: TAKE 1/2 TABLET BY MOUTH TWICE DAILY (TAKE WITH 200 MG TABLET)   Last seen 09/07/23 Next appt 09/17/24  Dispenses   Dispensed Days Supply Quantity Provider Pharmacy  LACOSAMIDE  200 MG TABS 05/27/2024 30 60 tablet Gayland Lauraine PARAS, NP [facility] ...  LACOSAMIDE  200 MG TABS 04/29/2024 30 60 tablet Gayland Lauraine PARAS, NP [facility] ...  LACOSAMIDE  200 MG TAB 04/01/2024 30 60 each Gayland Lauraine PARAS, NP [facility] ...  LACOSAMIDE  50 MG TABS 03/18/2024 90 90 tablet Camara, Amadou, MD [facility] ...  LACOSAMIDE  200 MG TABS 02/29/2024 30 60 tablet Gayland Lauraine PARAS, NP [facility] ...  LACOSAMIDE  200 MG TABS 01/30/2024 30 60 tablet Gayland Lauraine PARAS, NP [facility] ...  LACOSAMIDE  200 MG TABS 01/02/2024 30 60 tablet Gayland Lauraine PARAS, NP [facility] ...  LACOSAMIDE  50 MG TABS 12/19/2023 90 90 tablet Gregg Lek, MD [facility] ...  LACOSAMIDE  200 MG TABS 12/05/2023 30 60 tablet Gayland Lauraine PARAS, NP [facility] ...  LACOSAMIDE  200 MG TABS 11/06/2023 30 60 tablet Gayland Lauraine PARAS, NP [facility] ...  lacosamide  (VIMPAT ) tablet 10/03/2023 30 60 Gayland Lauraine PARAS, NP   LACOSAMIDE  50 MG TABS 09/18/2023 90 90 tablet Gregg Lek, MD [facility] ...  LACOSAMIDE  200 MG TABS 09/04/2023 30 60 tablet Gayland Lauraine PARAS, NP [facility] ...  LACOSAMIDE  200 MG TABS 08/04/2023 30 60 tablet Gayland Lauraine PARAS, NP [facility] ...  LACOSAMIDE  200 MG TABS 07/07/2023 30 60 tablet Athar, Saima, MD [facility] .SABRASABRA

## 2024-07-16 ENCOUNTER — Other Ambulatory Visit: Payer: Self-pay | Admitting: Neurology

## 2024-07-22 ENCOUNTER — Telehealth: Payer: Self-pay | Admitting: Neurology

## 2024-07-22 NOTE — Telephone Encounter (Signed)
 Please call to check on patient based on calling on call doctor yesterday. We can recheck his blood levels of seizure medication as trough this week. Thanks

## 2024-07-22 NOTE — Telephone Encounter (Signed)
 Patient called on-call physician on July 21, 2024, complaints of dizziness, lightheadedness, feeling high after taking his morning dose of Vimpat , he was not sure the exact dose of his seizure medications,  Chart reviewed, was last seen by Lauraine November 2024, last seizure July 2020, on high-dose combination Vimpat  225 mg twice a day, also on lamotrigine  200 mg twice a day, Lyrica  to 25 mg twice a day,  EEG was normal in July 2020  CT head was normal in 2008  Pending appointment with Lauraine in November, may consider check the level, adjusting medication dosage earlier than that

## 2024-07-22 NOTE — Telephone Encounter (Signed)
 Returned call to pt and he stated that he felt like vimpat  since the increase he has felt high and out of the ordinary. Would like to know how to proceed

## 2024-07-29 ENCOUNTER — Other Ambulatory Visit: Payer: Self-pay | Admitting: Neurology

## 2024-07-29 ENCOUNTER — Encounter: Payer: BC Managed Care – PPO | Admitting: Internal Medicine

## 2024-07-29 NOTE — Progress Notes (Deleted)
 Subjective:   Luke Vazquez [birth year] 07/29/2024  CC: No chief complaint on file.   HPI: Luke Vazquez is a 43 y.o. male who presents for a routine health maintenance exam.  Labs collected at time of visit.   HEALTH SCREENINGS: - PSA (50+): {Blank single:19197::Up to date,Ordered today,Not applicable,Declined,Done elsewhere}  No results found for: PSA1, PSA   - Colonoscopy (45+): {Blank single:19197::Up to date,Ordered today,Not applicable,Declined,Done elsewhere}  - AAA Screening: Not applicable  Men age 27-75 who have ever smoked - Lung Cancer screening with low-dose CT: Not applicable-  Adults age 9-80 who are current cigarette smokers or quit within the last 15 years. Must have 20 pack year history.   IMMUNIZATIONS:  - Tdap: Tetanus vaccination status reviewed: last tetanus booster within 10 years. - Influenza: {Blank single:19197::Up to date,Administered today,Postponed to flu season,Refused,Given elsewhere}    Past medical history, surgical history, medications, allergies, family history and social history reviewed with patient today and changes made to appropriate areas of the chart.   Social History   Socioeconomic History   Marital status: Single    Spouse name: Not on file   Number of children: 1   Years of education: 12+   Highest education level: Not on file  Occupational History   Not on file  Tobacco Use   Smoking status: Every Day    Current packs/day: 1.00    Average packs/day: 1 pack/day for 20.0 years (20.0 ttl pk-yrs)    Types: Cigarettes   Smokeless tobacco: Never  Vaping Use   Vaping status: Never Used  Substance and Sexual Activity   Alcohol use: Not Currently   Drug use: No   Sexual activity: Yes    Birth control/protection: None  Other Topics Concern   Not on file  Social History Narrative      Caffeine use: Drinks daily   Right handed    Social Drivers of Health   Financial Resource  Strain: Not on file  Food Insecurity: Not on file  Transportation Needs: Not on file  Physical Activity: Not on file  Stress: Not on file  Social Connections: Not on file  Intimate Partner Violence: Not on file    Past Medical History:  Diagnosis Date   Abrasion of right side of back 03/12/2016   Dental crowns present    Depression    Distal radius fracture, right 03/12/2016   Seizure disorder (HCC) 03/15/2018   Seizures (HCC)    last seizure > 1 yr. ago   Shoulder separation 03/12/2016   left    Past Surgical History:  Procedure Laterality Date   OPEN REDUCTION INTERNAL FIXATION (ORIF) DISTAL RADIAL FRACTURE Right 03/17/2016   Procedure: OPEN REDUCTION INTERNAL FIXATION (ORIF) RIGHT DISTAL RADIAL FRACTURE;  Surgeon: Franky Curia, MD;  Location: [facility];  Service: Orthopedics;  Laterality: Right;   WISDOM TOOTH EXTRACTION      Current Outpatient Medications on File Prior to Visit  Medication Sig   lacosamide  (VIMPAT ) 200 MG TABS tablet TAKE 1 TABLET BY MOUTH 2 TIMES A DAY   lacosamide  (VIMPAT ) 50 MG TABS tablet TAKE 1/2 TABLET BY MOUTH TWICE DAILY (TAKE WITH 200 MG TABLET)   lamoTRIgine  (LAMICTAL ) 200 MG tablet TAKE 1 TABLET BY MOUTH 2 TIMES A DAY   pregabalin  (LYRICA ) 225 MG capsule TAKE 1 CAPSULE BY MOUTH 2 TIMES DAILY   sertraline  (ZOLOFT ) 25 MG tablet TAKE 1 TABLET BY MOUTH EVERY DAY   traZODone  (DESYREL ) 50 MG tablet 1/4-1/2  tablet for sleep.   valACYclovir  (VALTREX ) 500 MG tablet Take 1 tablet (500 mg total) by mouth daily.   No current facility-administered medications on file prior to visit.    Allergies  Allergen Reactions   Escitalopram  Oxalate Other (See Comments)    Increased suicidal ideation  Makes symptoms worse  Makes Symptoms Worse   Phenergan [Promethazine Hcl] Nausea And Vomiting   Promethazine Other (See Comments)    No family history on file.   ROS: Denies fever, fatigue, unexplained weight loss/gain, hearing or vision changes,  cardiac or respiratory complaints. Denies neurological deficits, musculoskeletal complaints, gastrointestinal or genitourinary complaints, mental health complaints, and skin changes.   Objective:   There were no vitals filed for this visit.  GENERAL APPEARANCE: Well-appearing, in NAD. Well nourished.  SKIN: Pink, warm and dry. Turgor normal. No rash, lesion, ulceration, or ecchymoses. Hair evenly distributed.  HEENT: HEAD: Normocephalic.  EYES: PERRLA. EOMI. Lids intact w/o defect. Sclera white, Conjunctiva pink w/o exudate.  EARS: External ear w/o redness, swelling, masses or lesions. EAC clear. TM's intact, translucent w/o bulging, appropriate landmarks visualized. Appropriate acuity to conversational tones.  NOSE: Septum midline w/o deformity. Nares patent, mucosa pink and non-inflamed w/o drainage. No sinus tenderness.  THROAT: Uvula midline. Oropharynx clear. Tonsils non-inflamed w/o exudate ***. Oral mucosa pink and moist.  NECK: Supple, Trachea midline. Full ROM w/o pain or tenderness. No lymphadenopathy. Thyroid non-tender w/o enlargement or palpable masses.  RESPIRATORY: Chest wall symmetrical w/o masses. Respirations even and non-labored. Breath sounds clear to auscultation bilaterally. No wheezes, rales, rhonchi, or crackles. CARDIAC: S1, S2 present, regular rate and rhythm. No gallops, murmurs, rubs, or clicks. No carotid bruits. Capillary refill <2 seconds. Peripheral pulses 2+ bilaterally. GI: Abdomen soft w/o distention. Normoactive bowel sounds. No palpable masses or tenderness. No guarding or rebound tenderness. Liver and spleen w/o tenderness or enlargement. No CVA tenderness.  GU: *** deferred exam. MSK: Muscle tone and strength appropriate for age, w/o atrophy or abnormal movement. EXTREMITIES: Active ROM intact, w/o tenderness, crepitus, or contracture. No obvious joint deformities or effusions. No clubbing, edema, or cyanosis.  NEUROLOGIC: CN's II-XII intact. Motor strength  symmetrical with no obvious weakness. No sensory deficits. DTR 2+ symmetric bilaterally. Steady, even gait.  PSYCH/MENTAL STATUS: Alert, oriented x 3. Cooperative, appropriate mood and affect.   Depression and Anxiety Screen done today and results listed below:     12/28/2023    9:24 AM  Depression screen PHQ 2/9  Decreased Interest 0  Down, Depressed, Hopeless 0  PHQ - 2 Score 0  Altered sleeping 0  Tired, decreased energy 0  Change in appetite 0  Feeling bad or failure about yourself  0  Trouble concentrating 0  Moving slowly or fidgety/restless 0  Suicidal thoughts 0  PHQ-9 Score 0  Difficult doing work/chores Not difficult at all      12/28/2023    9:24 AM  GAD 7 : Generalized Anxiety Score  Nervous, Anxious, on Edge 0  Control/stop worrying 0  Worry too much - different things 0  Trouble relaxing 0  Restless 0  Easily annoyed or irritable 0  Afraid - awful might happen 0  Total GAD 7 Score 0  Anxiety Difficulty Not difficult at all    Assessment & Plan:  There are no diagnoses linked to this encounter.   No orders of the defined types were placed in this encounter.   PATIENT COUNSELING: - Encouraged to adjust caloric intake to maintain or achieve ideal body  weight, to reduce intake of dietary saturated fat and total fat, to limit sodium intake by avoiding high sodium foods and not adding table salt, and to maintain adequate dietary potassium and calcium preferably from fresh fruits, vegetables, and low-fat dairy products.   - Advised to avoid cigarette smoking. - Discussed with the patient that most people either abstain from alcohol or drink within safe limits (<=14/week and <=4 drinks/occasion for males, <=7/weeks and <= 3 drinks/occasion for females) and that the risk for alcohol disorders and other health effects rises proportionally with the number of drinks per week and how often a drinker exceeds daily limits. - Discussed cessation/primary prevention of drug  use and availability of treatment for abuse.   - Stressed the importance of regular exercise - Injury prevention: Discussed safety belts, safety helmets, smoke detector, smoking near bedding or upholstery.  - Dental health: Discussed importance of regular tooth brushing, flossing, and dental visits.  - Sexuality: Discussed sexually transmitted diseases, partner selection, use of condoms, avoidance of unintended pregnancy  and contraceptive alternatives.   NEXT PREVENTATIVE PHYSICAL DUE IN 1 YEAR.  No follow-ups on file.  Rosina Senters, FNP

## 2024-07-29 NOTE — Telephone Encounter (Signed)
 Requested Prescriptions   Pending Prescriptions Disp Refills   lacosamide  (VIMPAT ) 200 MG TABS tablet [Pharmacy Med Name: LACOSAMIDE  TAB 200MG ] 60 tablet     Sig: TAKE 1 TABLET BY MOUTH 2 TIMES A DAY   Last seen 09/07/23 Next appt 09/17/24  Dispenses   Dispensed Days Supply Quantity Provider Pharmacy  LACOSAMIDE  200 MG TABS 06/26/2024 30 60 tablet Gayland Lauraine PARAS, NP [facility] ...  LACOSAMIDE  50 MG TABS 06/18/2024 90 90 tablet Gayland Lauraine PARAS, NP [facility] ...  LACOSAMIDE  200 MG TABS 05/27/2024 30 60 tablet Gayland Lauraine PARAS, NP [facility] ...  LACOSAMIDE  200 MG TABS 04/29/2024 30 60 tablet Gayland Lauraine PARAS, NP [facility] ...  LACOSAMIDE  200 MG TAB 04/01/2024 30 60 each Gayland Lauraine PARAS, NP [facility] ...  LACOSAMIDE  50 MG TABS 03/18/2024 90 90 tablet Camara, Amadou, MD [facility] ...  LACOSAMIDE  200 MG TABS 02/29/2024 30 60 tablet Gayland Lauraine PARAS, NP [facility] ...  LACOSAMIDE  200 MG TABS 01/30/2024 30 60 tablet Gayland Lauraine PARAS, NP [facility] ...  LACOSAMIDE  200 MG TABS 01/02/2024 30 60 tablet Gayland Lauraine PARAS, NP [facility] ...  LACOSAMIDE  50 MG TABS 12/19/2023 90 90 tablet Gregg Lek, MD [facility] ...  LACOSAMIDE  200 MG TABS 12/05/2023 30 60 tablet Gayland Lauraine PARAS, NP [facility] ...  LACOSAMIDE  200 MG TABS 11/06/2023 30 60 tablet Gayland Lauraine PARAS, NP [facility] ...  LACOSAMIDE  200 MG TABS 10/03/2023 30 60 tablet Gayland Lauraine PARAS, NP [facility] ...  LACOSAMIDE  50 MG TABS 09/18/2023 90 90 tablet Gregg Lek, MD [facility] ...  LACOSAMIDE  200 MG TABS 09/04/2023 30 60 tablet Gayland Lauraine PARAS, NP [facility] ...  LACOSAMIDE  200 MG TABS 08/04/2023 30 60 tablet Gayland Lauraine PARAS, NP [facility] .[facility]

## 2024-07-29 NOTE — Telephone Encounter (Signed)
 Not sure what he means by the new dosing of Vimpat , because the dose has not changed recently, since 2022. Have him come for labs or we can move his appointment up if something opens up within the next week or so. His dose was increased back in 2022 when he switched from brand to generic, because he felt like the generic was wearing off. If he is feeling the dose is too strong, he can back it back down, I can call him tomorrow when I am in the office if needed.   Meds ordered this encounter  Medications   lacosamide  (VIMPAT ) 200 MG TABS tablet    Sig: TAKE 1 TABLET BY MOUTH 2 TIMES A DAY    Dispense:  60 tablet    Refill:  5

## 2024-08-03 ENCOUNTER — Other Ambulatory Visit: Payer: Self-pay | Admitting: Neurology

## 2024-08-05 NOTE — Telephone Encounter (Signed)
 Requested Prescriptions   Pending Prescriptions Disp Refills   pregabalin  (LYRICA ) 225 MG capsule [Pharmacy Med Name: PREGABALIN  CAP 225MG ] 180 capsule     Sig: TAKE 1 CAPSULE BY MOUTH 2 TIMES DAILY   Last seen 09/07/23, next appt 09/17/24 Dispenses   Dispensed Days Supply Quantity Provider Pharmacy  PREGABALIN  225 MG CAPS 05/06/2024 90 180 applicator Gayland Lauraine PARAS, NP [facility] ...  PREGABALIN  225 MG CAPS 02/06/2024 90 180 applicator Gayland Lauraine PARAS, NP [facility] ...  PREGABALIN  225 MG CAPS 11/06/2023 90 180 applicator Gayland Lauraine PARAS, NP [facility] .[facility]

## 2024-08-06 NOTE — Telephone Encounter (Signed)
 I called the patient. Some mornings/evening he feels lightheaded, after taking seizure medication, an hour later. Started back in 2022 when Vimpat  increased from 200 mg BID to 225 mg BID when he had to switch from brand to generic. The feeling had gone away but has come back recently. Will reduce Vimpat  back to 200 mg twice daily, can stop the 25 mg. Visit in Nov, will check labs.   Meds ordered this encounter  Medications   pregabalin  (LYRICA ) 225 MG capsule    Sig: TAKE 1 CAPSULE BY MOUTH 2 TIMES DAILY    Dispense:  180 capsule    Refill:  1

## 2024-08-14 ENCOUNTER — Encounter: Payer: Self-pay | Admitting: Internal Medicine

## 2024-09-17 ENCOUNTER — Encounter: Payer: Self-pay | Admitting: Neurology

## 2024-09-17 ENCOUNTER — Ambulatory Visit: Payer: BC Managed Care – PPO | Admitting: Neurology

## 2024-09-17 VITALS — BP 132/92 | HR 70 | Ht 70.0 in | Wt 179.8 lb

## 2024-09-17 DIAGNOSIS — G40909 Epilepsy, unspecified, not intractable, without status epilepticus: Secondary | ICD-10-CM

## 2024-09-17 NOTE — Patient Instructions (Signed)
 Great to see you today! Continue current medications Check routine labs Call for seizures Follow-up in 1 year with Dr. Gregg. Thanks!!
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
## 2024-09-19 ENCOUNTER — Ambulatory Visit: Payer: Self-pay | Admitting: Neurology

## 2024-09-19 MED ORDER — VITAMIN D (ERGOCALCIFEROL) 1.25 MG (50000 UNIT) PO CAPS: 50000.0000 [IU] | ORAL_CAPSULE | ORAL | 0 refills | Status: AC

## 2024-09-19 NOTE — Telephone Encounter (Signed)
 Meds ordered this encounter  Medications   Vitamin D, Ergocalciferol, (DRISDOL) 1.25 MG (50000 UNIT) CAPS capsule    Sig: Take 1 capsule (50,000 Units total) by mouth every 7 (seven) days.    Dispense:  12 capsule    Refill:  0   Vitamin D level is low at 5.8. sent him a my chart message.

## 2024-09-21 LAB — COMPREHENSIVE METABOLIC PANEL WITH GFR
ALT: 10 IU/L (ref 0–44)
AST: 13 IU/L (ref 0–40)
Albumin: 4.7 g/dL (ref 4.1–5.1)
Alkaline Phosphatase: 125 IU/L — ABNORMAL HIGH (ref 47–123)
BUN/Creatinine Ratio: 7 — ABNORMAL LOW (ref 9–20)
BUN: 7 mg/dL (ref 6–24)
Bilirubin Total: 0.4 mg/dL (ref 0.0–1.2)
CO2: 21 mmol/L (ref 20–29)
Calcium: 9.6 mg/dL (ref 8.7–10.2)
Chloride: 103 mmol/L (ref 96–106)
Creatinine, Ser: 1.05 mg/dL (ref 0.76–1.27)
Globulin, Total: 2.4 g/dL (ref 1.5–4.5)
Glucose: 115 mg/dL — ABNORMAL HIGH (ref 70–99)
Potassium: 4.5 mmol/L (ref 3.5–5.2)
Sodium: 141 mmol/L (ref 134–144)
Total Protein: 7.1 g/dL (ref 6.0–8.5)
eGFR: 89 mL/min/1.73 (ref >59)

## 2024-09-21 LAB — CBC WITH DIFFERENTIAL/PLATELET
Basophils Absolute: 0 x10E3/uL (ref 0.0–0.2)
Basos: 0 %
EOS (ABSOLUTE): 0.1 x10E3/uL (ref 0.0–0.4)
Eos: 1 %
Hematocrit: 49 % (ref 37.5–51.0)
Hemoglobin: 17.2 g/dL (ref 13.0–17.7)
Immature Grans (Abs): 0.1 x10E3/uL (ref 0.0–0.1)
Immature Granulocytes: 1 %
Lymphocytes Absolute: 3 x10E3/uL (ref 0.7–3.1)
Lymphs: 33 %
MCH: 33.5 pg — ABNORMAL HIGH (ref 26.6–33.0)
MCHC: 35.1 g/dL (ref 31.5–35.7)
MCV: 95 fL (ref 79–97)
Monocytes Absolute: 0.5 x10E3/uL (ref 0.1–0.9)
Monocytes: 5 %
Neutrophils Absolute: 5.4 x10E3/uL (ref 1.4–7.0)
Neutrophils: 60 %
Platelets: 365 x10E3/uL (ref 150–450)
RBC: 5.14 x10E6/uL (ref 4.14–5.80)
RDW: 12.4 % (ref 11.6–15.4)
WBC: 9 x10E3/uL (ref 3.4–10.8)

## 2024-09-21 LAB — LACOSAMIDE: Lacosamide: 10 ug/mL (ref 5.0–10.0)

## 2024-09-21 LAB — VITAMIN D 25 HYDROXY (VIT D DEFICIENCY, FRACTURES): Vit D, 25-Hydroxy: 5.8 ng/mL — ABNORMAL LOW (ref 30.0–100.0)

## 2024-09-21 LAB — LAMOTRIGINE LEVEL: Lamotrigine Lvl: 7.3 ug/mL (ref 2.0–20.0)

## 2024-10-10 DIAGNOSIS — Z3009 Encounter for other general counseling and advice on contraception: Secondary | ICD-10-CM | POA: Diagnosis not present

## 2024-11-12 ENCOUNTER — Other Ambulatory Visit: Payer: Self-pay | Admitting: Internal Medicine

## 2024-11-12 DIAGNOSIS — B009 Herpesviral infection, unspecified: Secondary | ICD-10-CM

## 2024-11-13 NOTE — Progress Notes (Signed)
 "   Chief Complaint: Consultation for vasectomy  History of Present Illness:  44 year old male is here today to discuss possible vasectomy.  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 ago   Shoulder separation 03/12/2016   left    Past Surgical History:  Past Surgical History:  Procedure Laterality Date   OPEN REDUCTION INTERNAL FIXATION (ORIF) DISTAL RADIAL FRACTURE Right 03/17/2016   Procedure: OPEN REDUCTION INTERNAL FIXATION (ORIF) RIGHT DISTAL RADIAL FRACTURE;  Surgeon: Franky Curia, MD;  Location: [facility];  Service: Orthopedics;  Laterality: Right;   WISDOM TOOTH EXTRACTION      Allergies:  Allergies[1]  Family History:  No family history on file.  Social History:  Social History[2]  Review of symptoms:  Constitutional:  Negative for unexplained weight loss, night sweats, fever, chills ENT:  Negative for nose bleeds, sinus pain, painful swallowing CV:  Negative for chest pain, shortness of breath, exercise intolerance, palpitations, loss of consciousness Resp:  Negative for cough, wheezing, shortness of breath GI:  Negative for nausea, vomiting, diarrhea, bloody stools GU:  Positives noted in HPI; otherwise negative for gross hematuria, dysuria, urinary incontinence Neuro:  Negative for seizures, poor balance, limb weakness, slurred speech Psych:  Negative for lack of energy, depression, anxiety Endocrine:  Negative for polydipsia, polyuria, symptoms of hypoglycemia (dizziness, hunger, sweating) Hematologic:  Negative for anemia, purpura, petechia, prolonged or excessive bleeding, use of anticoagulants  Allergic:  Negative for difficulty  breathing or choking as a result of exposure to anything; no shellfish allergy; no allergic response (rash/itch) to materials, foods  Physical exam: There were no vitals taken for this visit. GENERAL APPEARANCE:  Well appearing, well developed, well nourished, NAD HEENT: Atraumatic, Normocephalic. NECK: Normal appearance LUNGS: Normal inspiratory and expiratory excursion HEART: Regular Rate GU: Phallus normal, no lesions. Scrotal skin normal. Testicles/epididymal structures normal.  Vasa palpable EXTREMITIES: Moves all extremities well.  Without clubbing, cyanosis, or edema. NEUROLOGIC:  Alert and oriented x 3, normal gait, CN II-XII grossly intact.  MENTAL STATUS:  Appropriate. SKIN:  Warm, dry and intact.     Assessment: Desire for sterility   Plan: 1. We discussed the procedure in detail. He understands that this is a permanent form of sterilization  2. We discussed that vasectomy does not produce immediate infertility and he will need at least one semen sample showing no sperm before he can discontinue contraception. This typically takes about 3 months.  3. We discussed that there is a less than 1 in 1000 failure rate, and there is a minimal risk of infection.  4. There is less than a 1% chance of a postop scrotal hematoma or chronic scrotal pain  5. I advised him to refrain from intercourse for approximately 1 week and to continue contraception until semenalysis confirms that no sperm are present. Instructions were also given to limit heavy work/ lifting for 1 week.  6. We will schedule vasectomy at his convenience--he would prefer to do on a Thursday or Friday, so we will schedule him with Dr. Roseann      [1]  Allergies Allergen Reactions   Escitalopram  Oxalate Other (See Comments)    Increased  suicidal ideation  Makes symptoms worse  Makes Symptoms Worse   Phenergan [Promethazine Hcl] Nausea And Vomiting   Promethazine Other (See Comments)  [2]  Social  History Tobacco Use   Smoking status: Every Day    Current packs/day: 1.00    Average packs/day: 1 pack/day for 20.0 years (20.0 ttl pk-yrs)    Types: Cigarettes   Smokeless tobacco: Never  Vaping Use   Vaping status: Never Used  Substance Use Topics   Alcohol use: Not Currently   Drug use: No   "

## 2024-11-18 ENCOUNTER — Ambulatory Visit: Admitting: Urology

## 2024-11-18 VITALS — BP 127/93 | HR 88 | Ht 70.0 in | Wt 182.2 lb

## 2024-11-18 DIAGNOSIS — Z3009 Encounter for other general counseling and advice on contraception: Secondary | ICD-10-CM

## 2024-12-05 ENCOUNTER — Other Ambulatory Visit: Payer: Self-pay | Admitting: Psychiatry

## 2024-12-05 DIAGNOSIS — F422 Mixed obsessional thoughts and acts: Secondary | ICD-10-CM

## 2024-12-05 NOTE — Telephone Encounter (Signed)
 Not seen since 2024.  Needs appt for RF

## 2024-12-06 NOTE — Telephone Encounter (Signed)
 Scheduled 3/25

## 2024-12-25 ENCOUNTER — Encounter: Admitting: Urology

## 2025-01-22 ENCOUNTER — Ambulatory Visit: Admitting: Psychiatry

## 2025-09-17 ENCOUNTER — Ambulatory Visit: Admitting: Neurology
